# Patient Record
Sex: Male | Born: 1940 | Race: White | Hispanic: No | Marital: Married | Smoking: Current every day smoker
Health system: Southern US, Community
[De-identification: ages and names within clinical notes are randomized; demographics above are authoritative.]

## PROBLEM LIST (undated history)

## (undated) DIAGNOSIS — E039 Hypothyroidism, unspecified: Secondary | ICD-10-CM

## (undated) DIAGNOSIS — Z8679 Personal history of other diseases of the circulatory system: Secondary | ICD-10-CM

## (undated) DIAGNOSIS — I10 Essential (primary) hypertension: Secondary | ICD-10-CM

## (undated) DIAGNOSIS — I779 Disorder of arteries and arterioles, unspecified: Secondary | ICD-10-CM

## (undated) DIAGNOSIS — I739 Peripheral vascular disease, unspecified: Secondary | ICD-10-CM

## (undated) DIAGNOSIS — E785 Hyperlipidemia, unspecified: Secondary | ICD-10-CM

## (undated) DIAGNOSIS — T300 Burn of unspecified body region, unspecified degree: Secondary | ICD-10-CM

## (undated) HISTORY — DX: Hypothyroidism, unspecified: E03.9

## (undated) HISTORY — DX: Peripheral vascular disease, unspecified: I73.9

## (undated) HISTORY — DX: Personal history of other diseases of the circulatory system: Z86.79

## (undated) HISTORY — PX: OTHER SURGICAL HISTORY: SHX169

## (undated) HISTORY — DX: Essential (primary) hypertension: I10

## (undated) HISTORY — DX: Hyperlipidemia, unspecified: E78.5

## (undated) HISTORY — DX: Disorder of arteries and arterioles, unspecified: I77.9

---

## 2002-10-27 ENCOUNTER — Encounter: Payer: Self-pay | Admitting: Family Medicine

## 2002-10-27 ENCOUNTER — Ambulatory Visit (HOSPITAL_COMMUNITY): Admission: RE | Admit: 2002-10-27 | Discharge: 2002-10-27 | Payer: Self-pay | Admitting: Family Medicine

## 2004-10-29 ENCOUNTER — Ambulatory Visit (HOSPITAL_COMMUNITY): Admission: RE | Admit: 2004-10-29 | Discharge: 2004-10-29 | Payer: Self-pay | Admitting: General Surgery

## 2004-10-29 HISTORY — PX: INGUINAL HERNIA REPAIR: SUR1180

## 2010-02-12 ENCOUNTER — Emergency Department (HOSPITAL_COMMUNITY): Admission: EM | Admit: 2010-02-12 | Discharge: 2010-02-12 | Payer: Self-pay | Admitting: Emergency Medicine

## 2010-02-27 ENCOUNTER — Ambulatory Visit (HOSPITAL_COMMUNITY)
Admission: RE | Admit: 2010-02-27 | Discharge: 2010-02-27 | Payer: Self-pay | Source: Home / Self Care | Admitting: General Surgery

## 2010-03-25 HISTORY — PX: COLONOSCOPY W/ POLYPECTOMY: SHX1380

## 2010-06-04 LAB — GLUCOSE, CAPILLARY: Glucose-Capillary: 133 mg/dL — ABNORMAL HIGH (ref 70–99)

## 2010-06-05 LAB — COMPREHENSIVE METABOLIC PANEL
ALT: 17 U/L (ref 0–53)
AST: 20 U/L (ref 0–37)
Alkaline Phosphatase: 31 U/L — ABNORMAL LOW (ref 39–117)
CO2: 26 mEq/L (ref 19–32)
Calcium: 9.6 mg/dL (ref 8.4–10.5)
GFR calc Af Amer: >60 mL/min (ref >60)
Potassium: 4.1 mEq/L (ref 3.5–5.1)
Sodium: 138 mEq/L (ref 135–145)
Total Protein: 8.1 g/dL (ref 6.0–8.3)

## 2010-06-05 LAB — DIFFERENTIAL
Basophils Relative: 0 % (ref 0–1)
Eosinophils Absolute: 0.1 10*3/uL (ref 0.0–0.7)
Eosinophils Relative: 1 % (ref 0–5)
Lymphs Abs: 1.2 10*3/uL (ref 0.7–4.0)
Monocytes Relative: 4 % (ref 3–12)

## 2010-06-05 LAB — CBC
HCT: 46.1 % (ref 39.0–52.0)
Hemoglobin: 16.3 g/dL (ref 13.0–17.0)
MCHC: 35.4 g/dL (ref 30.0–36.0)
RDW: 13 % (ref 11.5–15.5)
WBC: 11.8 10*3/uL — ABNORMAL HIGH (ref 4.0–10.5)

## 2010-08-10 NOTE — H&P (Signed)
NAME:  [name], [name]           ACCOUNT NO.:  [id number]   MEDICAL RECORD NO.:  [id number]          PATIENT TYPE:  AMB   LOCATION:                                FACILITY:  [name]   PHYSICIAN:  Dalia Heading, M.D.  DATE OF BIRTH:  1940/12/28   DATE OF ADMISSION:  DATE OF DISCHARGE:  LH                                HISTORY & PHYSICAL   CHIEF COMPLAINT:  Right inguinal hernia.   HISTORY OF PRESENT ILLNESS:  The patient is a 70 year old white male who is  referred for evaluation and treatment of a right inguinal hernia. He has had  intermittent right groin pain with radiation to the right testicle for many  years, but recently has increased in frequency and intensity. It is made  worse with heavy lifting. No nausea or vomiting have been noted.   PAST MEDICAL HISTORY:  1.  Hypertension.  2.  Benign prostatic hypertrophy.   PAST SURGICAL HISTORY:  Unremarkable.   CURRENT MEDICATIONS:  Cardura and a baby aspirin.   ALLERGIES:  No known drug allergies.   REVIEW OF SYSTEMS:  The patient smokes a pack of cigarettes a day. He denies  any significant alcohol use. He denies any cardiopulmonary difficulties or  bleeding disorders.   PHYSICAL EXAMINATION:  GENERAL:  The patient is a well-developed, well-  nourished, white male in no acute distress. He is afebrile and vital signs  are stable.  LUNGS:  Clear to auscultation with equal breath sounds bilaterally.  HEART:  Reveals regular rate and rhythm without S3, S4, or murmurs.  ABDOMEN:  Soft and nondistended. He is tender in the right inguinal region  with a reducible right inguinal hernia noted. No hepatosplenomegaly or  masses are noted.  GENITOURINARY:  Within normal limits.   IMPRESSION:  Right inguinal hernia.   PLAN:  The patient is scheduled for right inguinal herniorrhaphy on October 29, 2004. The risks and benefits of the procedure including bleeding,  infection, and recurrence of the hernia were fully explained to the  patient  who gave informed consent.       MAJ/MEDQ  D:  10/25/2004  T:  10/25/2004  Job:  [id number]   cc:   [facility]  Fax: [phone]   Kirk Ruths, M.D.  [address]  [place]  [place] [address]  Fax: [phone]

## 2010-08-10 NOTE — Op Note (Signed)
NAME:  [name], [name]           ACCOUNT NO.:  [id number]   MEDICAL RECORD NO.:  [id number]          PATIENT TYPE:  AMB   LOCATION:  DAY                           FACILITY:  [name]   PHYSICIAN:  Dalia Heading, M.D.  DATE OF BIRTH:  November 29, 1940   DATE OF PROCEDURE:  10/29/2004  DATE OF DISCHARGE:                                 OPERATIVE REPORT   PREOPERATIVE DIAGNOSIS:  Right inguinal hernia.   POSTOPERATIVE DIAGNOSIS:  Right inguinal hernia, direct.   PROCEDURE:  Right inguinal herniorrhaphy.   SURGEON:  Dr. Franky Macho.   ANESTHESIA:  General.   INDICATIONS:  The patient is a 70 year old white male who presents with  symptomatic right inguinal hernia. Risks and benefits of the procedure  including bleeding, infection, pain, and recurrence of the hernia were fully  explained to the patient, who gave informed consent.   PROCEDURE NOTE:  The patient was placed in supine position. After general  anesthesia was administered, right groin region was prepped and draped using  the usual sterile technique with Betadine. Surgical site confirmation was  performed.   A right transverse inguinal incision was made down to the external oblique  aponeurosis. The aponeurosis was incised to the external ring. The  ilioinguinal nerve was identified and retracted superiorly from the  operative field. The Penrose drain was placed around the spermatic cord. The  vas deferens was noted in the spermatic cord. A lipoma of the cord was  excised. The patient was noted to have a direct hernia defect. This was  incised along its base, and a large-size polypropylene mesh plug was then  placed into this region and secured circumferentially to the transversalis  fascia using 2-0 Novofil interrupted sutures. An onlay polypropylene mesh  patch was then placed along the floor of the inguinal canal and secured  superiorly to the conjoined tendon and inferiorly to the shelving edge of  Poupart's ligament  using a 2-0 Novofil interrupted suture. The internal ring  was recreated using a 2-0 Novofil interrupted suture. The external oblique  aponeurosis was reapproximated using a 2-0 Vicryl running suture.  Subcutaneous layer was reapproximated using a 3-0 Vicryl interrupted suture.  The skin was closed using a 4-0 Vicryl subcuticular suture. Sensorcaine 0.5%  was instilled in the surrounding wound. Dermabond was then applied.   All tape and needle counts were correct at the end of the procedure. The  patient was awakened and transferred to PACU in stable condition.   COMPLICATIONS:  None.   SPECIMEN:  None.   BLOOD LOSS:  Minimal.       MAJ/MEDQ  D:  10/29/2004  T:  10/29/2004  Job:  [id number]   cc:   Kirk Ruths, M.D.  [address]  [place]  [place] [address]  Fax: [phone]

## 2010-10-23 ENCOUNTER — Ambulatory Visit (INDEPENDENT_AMBULATORY_CARE_PROVIDER_SITE_OTHER): Payer: Medicare Other | Admitting: Orthopedic Surgery

## 2010-10-23 ENCOUNTER — Encounter: Payer: Self-pay | Admitting: Orthopedic Surgery

## 2010-10-23 VITALS — Resp 16 | Ht 71.0 in | Wt 243.0 lb

## 2010-10-23 DIAGNOSIS — M23329 Other meniscus derangements, posterior horn of medial meniscus, unspecified knee: Secondary | ICD-10-CM

## 2010-10-23 NOTE — H&P (Signed)
[name] is an 70 y.o. male.   Chief Complaint: right knee pain  HPI: 70 years old male injured in November of 2011, RIGHT knee, twisted. Patient was able to continue hunting, things seemed to get better until last week or so when he started having vomiting 8/10. Medial knee pain associated with swelling and giving out of the RIGHT knee.  Clinical exam indicates torn medial meniscus visualized proceed with surgery. He did have a plain film, which was negative for any acute injury.    Past Medical History  Diagnosis Date  . HTN (hypertension)     Past Surgical History  Procedure Date  . Hernia repair     No family history on file. Social History:  reports that he has been smoking Cigarettes.  He has been smoking about 1 pack per day. He does not have any smokeless tobacco history on file. He reports that he does not drink alcohol or use illicit drugs.  Allergies: No Known Allergies  Medications Prior to Admission  Medication Sig Dispense Refill  . cephALEXin (KEFLEX) 500 MG capsule       . doxazosin (CARDURA) 4 MG tablet       . silver sulfADIAZINE (SILVADENE) 1 % cream        No current facility-administered medications on file as of 10/23/2010.    No results found for this or any previous visit (from the past 48 hour(s)). @RISRSLT48 @  Review of Systems  Constitutional: Negative.   HENT: Negative.   Eyes: Negative.   Respiratory: Negative.   Cardiovascular: Negative.   Gastrointestinal: Negative.   Genitourinary: Negative.   Musculoskeletal: Positive for joint pain.  Skin: Negative.   Neurological: Negative.   Endo/Heme/Allergies: Negative.   Psychiatric/Behavioral: Negative.     Resp. rate 16, height 5\' 11"  (1.803 m), weight 243 lb (110.224 kg). Physical Exam  Vital signs are stable as recorded  General appearance is normal  The patient is alert and oriented x3  The patient's mood and affect are normal  Gait assessment: normal  The  cardiovascular exam reveals normal pulses and temperature without edema swelling.  The lymphatic system is negative for palpable lymph nodes  The sensory exam is normal.  There are no pathologic reflexes.  Balance is normal.   Exam of the right knee  Inspection medial knee pain  Range of motion normal  Stability test were normal  Strength normal  Skin healing skin burn should be ok in 10 days    Assessment/Plan Medial meniscus tear right knee   [name]   [name] 10/23/2010, 4:02 PM

## 2010-10-23 NOTE — Progress Notes (Signed)
History done  in the hospital medical record.  Medial meniscal tear.  Plan for medial meniscectomy.  This procedure has been fully reviewed with the patient and written informed consent has been obtained.  You have been scheduled for surgery.  All surgeries carry some risk.  Remember you always have the option of continued nonsurgical treatment. However in this situation the risks vs. the benefits favor surgery as the best treatment option. The risks of the surgery includes the following but is not limited to bleeding, infection, pulmonary embolus, death from anesthesia, nerve injury vascular injury or need for further surgery, continued pain.  Specific to this procedure the following risks and complications are rare but possible Swelling

## 2010-10-26 ENCOUNTER — Encounter (HOSPITAL_COMMUNITY): Payer: Self-pay

## 2010-10-26 ENCOUNTER — Telehealth: Payer: Self-pay | Admitting: Orthopedic Surgery

## 2010-10-26 ENCOUNTER — Encounter (HOSPITAL_COMMUNITY)
Admission: RE | Admit: 2010-10-26 | Discharge: 2010-10-26 | Disposition: A | Discharge status: Home / Self Care | Payer: Medicare Other | Source: Ambulatory Visit | Attending: Orthopedic Surgery | Admitting: Orthopedic Surgery

## 2010-10-26 HISTORY — DX: Burn of unspecified body region, unspecified degree: T30.0

## 2010-10-26 LAB — CBC
MCH: 34.1 pg — ABNORMAL HIGH (ref 26.0–34.0)
MCHC: 33.8 g/dL (ref 30.0–36.0)
MCV: 100.9 fL — ABNORMAL HIGH (ref 78.0–100.0)
Platelets: 203 10*3/uL (ref 150–400)
RBC: 4.6 MIL/uL (ref 4.22–5.81)
RDW: 13 % (ref 11.5–15.5)

## 2010-10-26 LAB — BASIC METABOLIC PANEL
BUN: 8 mg/dL (ref 6–23)
CO2: 23 mEq/L (ref 19–32)
Calcium: 9.7 mg/dL (ref 8.4–10.5)
Creatinine, Ser: 1.12 mg/dL (ref 0.50–1.35)
Glucose, Bld: 147 mg/dL — ABNORMAL HIGH (ref 70–99)

## 2010-10-26 NOTE — Telephone Encounter (Signed)
Contacted insurer [employer] at ph [phone] RE: out-patient surgery scheduled at [facility] 11/02/10.  CPT [id number], [id number], ICD9 717.2, per phone with Nanci Pina, no pre-authorization required for in-network providers.

## 2010-10-26 NOTE — Patient Instructions (Signed)
20 [name]  10/26/2010   Your procedure is scheduled on:  Friday, 11/02/10  [facility] at [facility]:30 [facility].  Call this number if you have problems the morning of surgery: [phone]   Remember:   Do not eat food:After Midnight.  Do not drink clear liquids: After Midnight.  Take these medicines the morning of surgery with A SIP OF WATER: Cardura   Do not wear jewelry, make-up or nail polish.  Do not wear lotions, powders, or perfumes. You may wear deodorant.  Do not shave 48 hours prior to surgery.  Do not bring valuables to the hospital.  Contacts, dentures or bridgework may not be worn into surgery.  Leave suitcase in the car. After surgery it may be brought to your room.  For patients admitted to the hospital, checkout time is 11:00 AM the day of discharge.   Patients discharged the day of surgery will not be allowed to drive home.  Name and phone number of your driver: wife, [name]  Special Instructions: CHG Shower Use Special Wash: 1/2 bottle night before surgery and 1/2 bottle morning of surgery.   Please read over the following fact sheets that you were given: Pain Booklet, MRSA Information, Surgical Site Infection Prevention, Anesthesia Post-op Instructions and Care and Recovery After Surgery  General Instructions for Surgery These instructions are generic. They cover multiple surgeries and are a general pre-surgical guideline. They do not apply to all procedures. You may have questions. Answers will be provided by your caregiver. PREPARING FOR SURGERY  Stop smoking at least two weeks prior to surgery. This lowers risk during surgery. Ask your caregiver for help with this if needed. The benefits are well worth it. This is a good time for a healthy lifestyle change.   Your caregiver may advise that you stop taking certain medications that may affect the outcome of the surgery and your ability to heal. For example, you may need to stop taking anti-inflammatories, such as  aspirin or ibuprofen because of possible bleeding problems. Other medications may have interactions with anesthesia.   BE SURE TO LET YOUR CAREGIVER KNOW IF YOU HAVE BEEN ON STEROIDS FOR LONG PERIODS OF TIME. THIS IS CRITICAL.   Your caregiver will discuss possible risks and complications with you before surgery. In addition to the usual risks of anesthesia, other common risks and complications include blood loss and replacement (not applicable to minor surgical procedures), temporary increase in pain due to surgery, uncorrected pain or problems the surgery was meant to correct, infection, or new damage.  BEFORE SURGERY Arrive   prior to your procedure or as your caregiver suggests. Check in at the admissions desk to fill out necessary forms if you are not pre-registered. There will be consent forms to sign prior to the procedure. There is a waiting area for your family while you are having your procedure.  LET YOUR CAREGIVERS KNOW ABOUT THE FOLLOWING:  Allergies.  Medications taken including herbs, eye drops, over the counter medications, and creams.   Use of steroids (by mouth or creams).   Previous problems with anesthetics or novocaine.   Possibility of pregnancy, if this applies.  History of blood clots (thrombophlebitis).   History of bleeding or blood problems.   Previous surgery.   Other health problems.   FOLLOWING SURGERY You will be taken to the recovery area where a nurse will watch and check your progress. Once you are awake, stable, and taking fluids well, barring other problems you will  be allowed to go home. Once home, an ice pack wrapped in a light towel applied to your operative site may help with discomfort and keep the swelling down. Follow instructions as suggested by your caregiver. HOME CARE INSTRUCTIONS  Follow your caregiver's instructions as to activities, exercises, physical therapy, or driving a car.   Weight reduction may be helpful depending upon the type  of surgery.   Daily exercise is helpful. Maintain strength and range of motion as instructed.   Only take over-the-counter or prescription medicines for pain, discomfort, or fever as directed by your caregiver.  SEEK MEDICAL CARE IF:  You notice increased bleeding (more than a small spot) from the wound.   You soak more than four pads following a uterine procedure unless instructed otherwise.   There is redness, swelling, or increasing pain in the wound.   You notice pus coming from wound.   An unexplained oral temperature over 101 develops, or as your caregiver suggests.   You notice a foul smell coming from the wound or dressing.   You become light headed or if you pass out (faint).  SEEK IMMEDIATE MEDICAL CARE IF:  You develop a rash.   You have difficulty breathing.   You develop any allergic problems.  Document Released: 06/17/2000 Document Re-Released: 06/07/2008 [facility] Patient Information 2011 [place], [place]. PATIENT INSTRUCTIONS POST-ANESTHESIA  IMMEDIATELY FOLLOWING SURGERY:  Do not drive or operate machinery for the first twenty four hours after surgery.  Do not make any important decisions for twenty four hours after surgery or while taking narcotic pain medications or sedatives.  If you develop intractable nausea and vomiting or a severe headache please notify your doctor immediately.  FOLLOW-UP:  Please make an appointment with your surgeon as instructed. You do not need to follow up with anesthesia unless specifically instructed to do so.  WOUND CARE INSTRUCTIONS (if applicable):  Keep a dry clean dressing on the anesthesia/puncture wound site if there is drainage.  Once the wound has quit draining you may leave it open to air.  Generally you should leave the bandage intact for twenty four hours unless there is drainage.  If the epidural site drains for more than 36-48 hours please call the anesthesia department.  QUESTIONS?:  Please feel free to call your  physician or the hospital operator if you have any questions, and they will be happy to assist you.

## 2010-11-01 ENCOUNTER — Other Ambulatory Visit: Payer: Self-pay | Admitting: Orthopedic Surgery
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
## 2010-11-02 ENCOUNTER — Encounter (HOSPITAL_COMMUNITY): Payer: Self-pay | Admitting: Anesthesiology

## 2010-11-02 ENCOUNTER — Encounter (HOSPITAL_COMMUNITY): Payer: Self-pay | Admitting: *Deleted

## 2010-11-02 ENCOUNTER — Ambulatory Visit (HOSPITAL_COMMUNITY)
Admission: RE | Admit: 2010-11-02 | Discharge: 2010-11-02 | Disposition: A | Discharge status: Home / Self Care | Payer: Medicare Other | Source: Ambulatory Visit | Attending: Orthopedic Surgery | Admitting: Orthopedic Surgery

## 2010-11-02 ENCOUNTER — Ambulatory Visit (HOSPITAL_COMMUNITY): Payer: Medicare Other

## 2010-11-02 ENCOUNTER — Encounter (HOSPITAL_COMMUNITY)
Admission: RE | Disposition: A | Discharge status: Home / Self Care | Payer: Self-pay | Source: Ambulatory Visit | Attending: Orthopedic Surgery

## 2010-11-02 ENCOUNTER — Ambulatory Visit (HOSPITAL_COMMUNITY): Payer: Medicare Other | Admitting: Anesthesiology

## 2010-11-02 DIAGNOSIS — IMO0002 Reserved for concepts with insufficient information to code with codable children: Secondary | ICD-10-CM | POA: Insufficient documentation

## 2010-11-02 DIAGNOSIS — M23329 Other meniscus derangements, posterior horn of medial meniscus, unspecified knee: Secondary | ICD-10-CM

## 2010-11-02 DIAGNOSIS — Z79899 Other long term (current) drug therapy: Secondary | ICD-10-CM | POA: Insufficient documentation

## 2010-11-02 DIAGNOSIS — M1710 Unilateral primary osteoarthritis, unspecified knee: Secondary | ICD-10-CM | POA: Insufficient documentation

## 2010-11-02 DIAGNOSIS — Z01812 Encounter for preprocedural laboratory examination: Secondary | ICD-10-CM | POA: Insufficient documentation

## 2010-11-02 DIAGNOSIS — M23305 Other meniscus derangements, unspecified medial meniscus, unspecified knee: Secondary | ICD-10-CM | POA: Insufficient documentation

## 2010-11-02 DIAGNOSIS — I10 Essential (primary) hypertension: Secondary | ICD-10-CM | POA: Insufficient documentation

## 2010-11-02 SURGERY — ARTHROSCOPY, KNEE, WITH MEDIAL MENISCECTOMY
Anesthesia: General | Site: Knee | Laterality: Right | Wound class: Clean

## 2010-11-02 MED ORDER — ACETAMINOPHEN 325 MG PO TABS: 325.0000 mg | ORAL_TABLET | ORAL | Status: DC | PRN

## 2010-11-02 MED ORDER — PROPOFOL 10 MG/ML IV EMUL
INTRAVENOUS | Status: AC
  Filled 2010-11-02: qty 20

## 2010-11-02 MED ORDER — HYDROCODONE-ACETAMINOPHEN 5-325 MG PO TABS
ORAL_TABLET | ORAL | Status: AC
  Administered 2010-11-02: 1 via ORAL
  Filled 2010-11-02: qty 1

## 2010-11-02 MED ORDER — LACTATED RINGERS IV SOLN
INTRAVENOUS | Status: DC
  Administered 2010-11-02: 09:00:00 via INTRAVENOUS

## 2010-11-02 MED ORDER — CELECOXIB 100 MG PO CAPS
400.0000 mg | ORAL_CAPSULE | Freq: Once | ORAL | Status: AC
  Administered 2010-11-02: 400 mg via ORAL

## 2010-11-02 MED ORDER — FENTANYL CITRATE 0.05 MG/ML IJ SOLN: 25.0000 ug | INTRAMUSCULAR | Status: DC | PRN

## 2010-11-02 MED ORDER — FENTANYL CITRATE 0.05 MG/ML IJ SOLN
INTRAMUSCULAR | Status: DC | PRN
  Administered 2010-11-02 (×2): 50 ug via INTRAVENOUS

## 2010-11-02 MED ORDER — BUPIVACAINE-EPINEPHRINE PF 0.5-1:200000 % IJ SOLN
INTRAMUSCULAR | Status: AC
  Filled 2010-11-02: qty 10

## 2010-11-02 MED ORDER — ONDANSETRON HCL 4 MG/2ML IJ SOLN
INTRAMUSCULAR | Status: AC
  Administered 2010-11-02: 4 mg via INTRAVENOUS
  Filled 2010-11-02: qty 2

## 2010-11-02 MED ORDER — MIDAZOLAM HCL 2 MG/2ML IJ SOLN
INTRAMUSCULAR | Status: AC
  Filled 2010-11-02: qty 2

## 2010-11-02 MED ORDER — ONDANSETRON HCL 4 MG/2ML IJ SOLN
4.0000 mg | Freq: Once | INTRAMUSCULAR | Status: AC | PRN
  Administered 2010-11-02: 4 mg via INTRAVENOUS

## 2010-11-02 MED ORDER — PROPOFOL 10 MG/ML IV EMUL
INTRAVENOUS | Status: DC | PRN
  Administered 2010-11-02: 160 mg via INTRAVENOUS

## 2010-11-02 MED ORDER — ONDANSETRON HCL 4 MG/2ML IJ SOLN
4.0000 mg | Freq: Once | INTRAMUSCULAR | Status: AC
  Administered 2010-11-02: 4 mg via INTRAVENOUS

## 2010-11-02 MED ORDER — HYDROCODONE-ACETAMINOPHEN 7.5-325 MG PO TABS: 1.0000 | ORAL_TABLET | ORAL | Status: AC | PRN

## 2010-11-02 MED ORDER — ACETAMINOPHEN 500 MG PO TABS
500.0000 mg | ORAL_TABLET | Freq: Once | ORAL | Status: AC
  Administered 2010-11-02: 500 mg via ORAL

## 2010-11-02 MED ORDER — CEFAZOLIN SODIUM 1-5 GM-% IV SOLN
INTRAVENOUS | Status: AC
  Filled 2010-11-02: qty 50

## 2010-11-02 MED ORDER — CEFAZOLIN SODIUM 1-5 GM-% IV SOLN: 1.0000 g | INTRAVENOUS | Status: DC

## 2010-11-02 MED ORDER — BUPIVACAINE-EPINEPHRINE 0.5% -1:200000 IJ SOLN
INTRAMUSCULAR | Status: DC | PRN
  Administered 2010-11-02 (×2): 30 mL

## 2010-11-02 MED ORDER — SODIUM CHLORIDE 0.9 % IR SOLN
Status: DC | PRN
  Administered 2010-11-02: 10:00:00

## 2010-11-02 MED ORDER — CELECOXIB 100 MG PO CAPS
ORAL_CAPSULE | ORAL | Status: AC
  Administered 2010-11-02: 400 mg via ORAL
  Filled 2010-11-02: qty 4

## 2010-11-02 MED ORDER — EPINEPHRINE HCL 1 MG/ML IJ SOLN
INTRAMUSCULAR | Status: AC
  Filled 2010-11-02: qty 4

## 2010-11-02 MED ORDER — ONDANSETRON HCL 4 MG/2ML IJ SOLN: 4.0000 mg | Freq: Once | INTRAMUSCULAR | Status: DC

## 2010-11-02 MED ORDER — GLYCOPYRROLATE 0.2 MG/ML IJ SOLN
0.2000 mg | Freq: Once | INTRAMUSCULAR | Status: AC | PRN
  Administered 2010-11-02: 0.2 mg via INTRAVENOUS

## 2010-11-02 MED ORDER — MIDAZOLAM HCL 5 MG/5ML IJ SOLN
INTRAMUSCULAR | Status: DC | PRN
  Administered 2010-11-02 (×2): 2 mg via INTRAVENOUS

## 2010-11-02 MED ORDER — GLYCOPYRROLATE 0.2 MG/ML IJ SOLN
INTRAMUSCULAR | Status: AC
  Filled 2010-11-02: qty 1

## 2010-11-02 MED ORDER — HYDROCODONE-ACETAMINOPHEN 5-325 MG PO TABS
1.0000 | ORAL_TABLET | Freq: Once | ORAL | Status: AC
  Administered 2010-11-02: 1 via ORAL

## 2010-11-02 MED ORDER — ONDANSETRON HCL 4 MG/2ML IJ SOLN
INTRAMUSCULAR | Status: AC
  Filled 2010-11-02: qty 2

## 2010-11-02 MED ORDER — CEFAZOLIN SODIUM 1-5 GM-% IV SOLN
INTRAVENOUS | Status: DC | PRN
  Administered 2010-11-02: 1 g via INTRAVENOUS

## 2010-11-02 MED ORDER — ACETAMINOPHEN 500 MG PO TABS
ORAL_TABLET | ORAL | Status: AC
  Administered 2010-11-02: 500 mg via ORAL
  Filled 2010-11-02: qty 1

## 2010-11-02 MED ORDER — EPINEPHRINE HCL 1 MG/ML IJ SOLN
INTRAMUSCULAR | Status: AC
  Filled 2010-11-02: qty 2

## 2010-11-02 MED ORDER — FENTANYL CITRATE 0.05 MG/ML IJ SOLN
INTRAMUSCULAR | Status: AC
  Filled 2010-11-02: qty 2

## 2010-11-02 MED ORDER — LACTATED RINGERS IV SOLN
INTRAVENOUS | Status: DC | PRN
  Administered 2010-11-02: 09:00:00 via INTRAVENOUS

## 2010-11-02 MED ORDER — MIDAZOLAM HCL 2 MG/2ML IJ SOLN
1.0000 mg | INTRAMUSCULAR | Status: DC | PRN
  Administered 2010-11-02: 2 mg via INTRAVENOUS

## 2010-11-02 SURGICAL SUPPLY — 57 items
ARTHROWAND PARAGON T2 (SURGICAL WAND)
BAG HAMPER (MISCELLANEOUS) ×2 IMPLANT
BANDAGE ELASTIC 6 VELCRO NS (GAUZE/BANDAGES/DRESSINGS) ×2 IMPLANT
BLADE AGGRESSIVE PLUS 4.0 (BLADE) ×2 IMPLANT
BLADE SURG SZ11 CARB STEEL (BLADE) ×2 IMPLANT
CHLORAPREP W/TINT 26ML (MISCELLANEOUS) ×2 IMPLANT
CLOTH BEACON ORANGE TIMEOUT ST (SAFETY) ×2 IMPLANT
COOLER CRYO IC GRAV AND TUBE (ORTHOPEDIC SUPPLIES) ×2 IMPLANT
CUFF CRYO KNEE LG 20X31 COOLER (ORTHOPEDIC SUPPLIES) IMPLANT
CUFF CRYO KNEE18X23 MED (MISCELLANEOUS) ×1 IMPLANT
CUFF TOURNIQUET SINGLE 34IN LL (TOURNIQUET CUFF) ×1 IMPLANT
CUFF TOURNIQUET SINGLE 44IN (TOURNIQUET CUFF) IMPLANT
CUTTER ANGLED DBL BITE 4.5 (BURR) IMPLANT
DECANTER SPIKE VIAL GLASS SM (MISCELLANEOUS) ×4 IMPLANT
FLOOR PAD 36X40 (MISCELLANEOUS)
GAUZE SPONGE 4X4 16PLY XRAY LF (GAUZE/BANDAGES/DRESSINGS) ×2 IMPLANT
GAUZE XEROFORM 5X9 LF (GAUZE/BANDAGES/DRESSINGS) ×2 IMPLANT
GLOVE BIOGEL PI IND STRL 7.5 (GLOVE) IMPLANT
GLOVE BIOGEL PI INDICATOR 7.5 (GLOVE) ×1
GLOVE ECLIPSE 7.0 STRL STRAW (GLOVE) ×2 IMPLANT
GLOVE SKINSENSE NS SZ8.0 LF (GLOVE) ×1
GLOVE SKINSENSE STRL SZ8.0 LF (GLOVE) ×1 IMPLANT
GLOVE SS N UNI LF 8.5 STRL (GLOVE) ×2 IMPLANT
GOWN BRE IMP SLV AUR XL STRL (GOWN DISPOSABLE) ×2 IMPLANT
GOWN STRL REIN XL XLG (GOWN DISPOSABLE) ×2 IMPLANT
HLDR LEG FOAM (MISCELLANEOUS) ×1 IMPLANT
IV NS IRRIG 3000ML ARTHROMATIC (IV SOLUTION) ×5 IMPLANT
KIT BLADEGUARD II DBL (SET/KITS/TRAYS/PACK) ×2 IMPLANT
KIT ROOM TURNOVER AP CYSTO (KITS) ×2 IMPLANT
LEG HOLDER FOAM (MISCELLANEOUS) ×1
MANIFOLD NEPTUNE II (INSTRUMENTS) ×2 IMPLANT
MARKER SKIN DUAL TIP RULER LAB (MISCELLANEOUS) ×2 IMPLANT
NDL HYPO 18GX1.5 BLUNT FILL (NEEDLE) ×1 IMPLANT
NDL HYPO 21X1.5 SAFETY (NEEDLE) ×1 IMPLANT
NDL SPNL 18GX3.5 QUINCKE PK (NEEDLE) ×1 IMPLANT
NEEDLE HYPO 18GX1.5 BLUNT FILL (NEEDLE) ×2 IMPLANT
NEEDLE HYPO 21X1.5 SAFETY (NEEDLE) ×2 IMPLANT
NEEDLE SPNL 18GX3.5 QUINCKE PK (NEEDLE) ×2 IMPLANT
NS IRRIG 1000ML POUR BTL (IV SOLUTION) ×2 IMPLANT
PACK ARTHRO LIMB DRAPE STRL (MISCELLANEOUS) ×2 IMPLANT
PAD ABD 5X9 TENDERSORB (GAUZE/BANDAGES/DRESSINGS) ×2 IMPLANT
PAD ARMBOARD 7.5X6 YLW CONV (MISCELLANEOUS) ×2 IMPLANT
PAD FLOOR 36X40 (MISCELLANEOUS) ×1 IMPLANT
PADDING CAST COTTON 6X4 STRL (CAST SUPPLIES) ×2 IMPLANT
PADDING WEBRIL 4 STERILE (GAUZE/BANDAGES/DRESSINGS) ×1 IMPLANT
SET ARTHROSCOPY INST (INSTRUMENTS) ×2 IMPLANT
SET ARTHROSCOPY PUMP TUBE (IRRIGATION / IRRIGATOR) ×2 IMPLANT
SET BASIN LINEN APH (SET/KITS/TRAYS/PACK) ×2 IMPLANT
SPONGE GAUZE 4X4 12PLY (GAUZE/BANDAGES/DRESSINGS) ×2 IMPLANT
STRIP CLOSURE SKIN 1/2X4 (GAUZE/BANDAGES/DRESSINGS) ×1 IMPLANT
SUT ETHILON 3 0 FSL (SUTURE) IMPLANT
SYR 30ML LL (SYRINGE) ×2 IMPLANT
SYRINGE 10CC LL (SYRINGE) ×2 IMPLANT
WAND 50 DEG COVAC W/CORD (SURGICAL WAND) IMPLANT
WAND 90 DEG TURBOVAC W/CORD (SURGICAL WAND) ×1 IMPLANT
WAND ARTHRO PARAGON T2 (SURGICAL WAND) IMPLANT
YANKAUER SUCT BULB TIP 10FT TU (MISCELLANEOUS) ×6 IMPLANT

## 2010-11-02 NOTE — Transfer of Care (Signed)
Immediate Anesthesia Transfer of Care Note  Patient: [name]  Procedure(s) Performed:  KNEE ARTHROSCOPY WITH MEDIAL MENISECTOMY - With Partial Medial Menisectomy  Patient Location: PACU  Anesthesia Type: General  Level of Consciousness: sedated and patient cooperative  Airway & Oxygen Therapy: Patient Spontanous Breathing and Patient connected to face mask oxygen  Post-op Assessment: Report given to PACU RN, Post -op Vital signs reviewed and stable and Patient moving all extremities  Post vital signs: Reviewed and stable  Complications: No apparent anesthesia complications

## 2010-11-02 NOTE — Anesthesia Postprocedure Evaluation (Signed)
  Anesthesia Post-op Note  Patient: [name]  Procedure(s) Performed:  KNEE ARTHROSCOPY WITH MEDIAL MENISECTOMY - With Partial Medial Menisectomy  Patient Location: PACU  Anesthesia Type: General  Level of Consciousness: awake, alert , oriented and patient cooperative  Airway and Oxygen Therapy: Patient Spontanous Breathing  Post-op Pain: none  Post-op Assessment: Post-op Vital signs reviewed, Patient's Cardiovascular Status Stable, Respiratory Function Stable, Patent Airway, No signs of Nausea or vomiting and Pain level controlled  Post-op Vital Signs: Reviewed and stable  Complications: No apparent anesthesia complications
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
## 2010-11-02 NOTE — Interval H&P Note (Signed)
History and Physical Interval Note:   11/02/2010   8:59 AM   [name]  has presented today for surgery, with the diagnosis of torn medial meniscus right knee  The various methods of treatment have been discussed with the patient and family. After consideration of risks, benefits and other options for treatment, the patient has consented to  Procedure(s):RIGHT  KNEE ARTHROSCOPY WITH MEDIAL MENISECTOMY as a surgical intervention .  I have reviewed the patients' chart and labs.  Questions were answered to the patient's satisfaction.     [name]  MD

## 2010-11-02 NOTE — Anesthesia Procedure Notes (Signed)
Procedure Name: LMA Insertion Date/Time: 11/02/2010 9:38 AM Performed by: Despina Hidden Patient Re-evaluated:Patient Re-evaluated prior to inductionOxygen Delivery Method: Circle System Utilized Preoxygenation: Pre-oxygenation with 100% oxygen Intubation Type: IV induction Ventilation: Mask ventilation with difficulty LMA: LMA inserted LMA Size: 4.0 Number of attempts: 1 Placement Confirmation: breath sounds checked- equal and bilateral and positive ETCO2 Tube secured with: Tape

## 2010-11-02 NOTE — Brief Op Note (Signed)
11/02/2010  10:16 AM  PATIENT:  [name]  70 y.o. male  PRE-OPERATIVE DIAGNOSIS:  torn medial meniscus right knee  POST-OPERATIVE DIAGNOSIS:  torn medial meniscus right knee                                                         OA knee moderate PROCEDURE:  Procedure(s): RIGHT KNEE ARTHROSCOPY WITH MEDIAL MENISECTOMY  SURGEON:  Surgeon(s): [name], MD  PHYSICIAN ASSISTANT:   ASSISTANTS: none   ANESTHESIA:   general  ESTIMATED BLOOD LOSS: * No blood loss amount entered *   BLOOD ADMINISTERED:none  DRAINS: none   LOCAL MEDICATIONS USED:  MARCAINE 60CC  SPECIMEN:  No Specimen  DISPOSITION OF SPECIMEN:  N/A  COUNTS:  YES  TOURNIQUET:  * Missing tourniquet times found for documented tourniquets in log:  1690 *  DICTATION #:   PLAN OF CARE: pacu then discharge   PATIENT DISPOSITION:  PACU - hemodynamically stable.   Delay start of Pharmacological VTE agent (>24hrs) due to surgical blood loss or risk of bleeding:  not applicable

## 2010-11-02 NOTE — Op Note (Signed)
Preop diagnosis osteoarthritis right knee with torn medial meniscus Postop diagnosis same Procedure arthroscopy right knee partial medial meniscectomy Surgeon Romeo Apple Anesthesia Gen., LMA Assistants none Operative findings tear of the posterior horn of the medial meniscus, moderate synovitis, moderate arthritis. 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 Complications none, counts were correct Details of procedure the patient was identified as [name] the surgical site was marked in the chart was updated consent was signed. The patient was taken to the operating room given 1 g of Ancef and then general anesthesia was admitted via LMA technique.  The right leg was placed in an arthroscopic leg holder and prepped and draped sterilely. Timeout procedure was completed  A lateral incision was made lateral to the patellar tendon and the scope was placed into the joint. Diagnostic arthroscopy was commenced. After thorough tour of the knee a medial portal was established a probe was placed into the joint and the meniscal tear on the medial side was identified and characterized as a degenerative posterior horn tear. We also noted in the medial compartment grade 2-3 changes of the medial femoral condyle with significant synovitis noted.  A shaver was placed into the joint to debride the meniscal tear and then a duckbill forceps was used to continue resection of the torn tissue. The meniscal fragments were removed with a motorized shaver and the meniscus was balanced with the same shaver. An ArthroCare wand 90 was placed into the joint to control bleeding.  Limited debridement was performed mainly for visualization.  A probe was placed back into the joint to confirm a stable rim of meniscal tissue remaining.  The joint was irrigated and closed with 3-0 nylon suture. 60 cc of Marcaine was injected into the  joint with epinephrine.  After sterile dressings were applied a Cryo/Cuff was placed and activated  The patient was extubated and taken to the recovery room in stable condition  Postoperative plan is for the patient to be discharged home, useful weightbearing. He will followup on Monday the 13th. We will order physical therapy at that time. He is discharged with Norco 7.5 mg for pain.

## 2010-11-02 NOTE — Anesthesia Preprocedure Evaluation (Addendum)
Anesthesia Evaluation  Name, MR# and DOB Patient awake  General Assessment Comment  Reviewed: Allergy & Precautions, H&P , NPO status , Patient's Chart, lab work & pertinent test results  History of Anesthesia Complications Negative for: history of anesthetic complications  Airway Mallampati: II TM Distance: >3 FB Neck ROM: Full    Dental No notable dental hx.    Pulmonary    pulmonary exam normalPulmonary Exam Normal     Cardiovascular hypertension, Pt. on medications Regular Normal    Neuro/Psych Negative Neurological ROS  Negative Psych ROS  GI/Hepatic/Renal negative GI ROS, negative Liver ROS, and negative Renal ROS (+)       Endo/Other  Negative Endocrine ROS (+)      Abdominal Normal abdominal exam  (+)   Musculoskeletal negative musculoskeletal ROS (+)   Hematology negative hematology ROS (+)   Peds  Reproductive/Obstetrics    Anesthesia Other Findings             Anesthesia Physical Anesthesia Plan  ASA: II  Anesthesia Plan: General   Post-op Pain Management:    Induction: Intravenous  Airway Management Planned: LMA  Additional Equipment:   Intra-op Plan:   Post-operative Plan: Extubation in OR  Informed Consent: I have reviewed the patients History and Physical, chart, labs and discussed the procedure including the risks, benefits and alternatives for the proposed anesthesia with the patient or authorized representative who has indicated his/her understanding and acceptance.     Plan Discussed with: CRNA  Anesthesia Plan Comments:         Anesthesia Quick Evaluation

## 2010-11-05 ENCOUNTER — Ambulatory Visit (INDEPENDENT_AMBULATORY_CARE_PROVIDER_SITE_OTHER): Payer: Medicare Other | Admitting: Orthopedic Surgery

## 2010-11-05 DIAGNOSIS — Z9889 Other specified postprocedural states: Secondary | ICD-10-CM

## 2010-11-05 DIAGNOSIS — M1710 Unilateral primary osteoarthritis, unspecified knee: Secondary | ICD-10-CM

## 2010-11-05 DIAGNOSIS — M23329 Other meniscus derangements, posterior horn of medial meniscus, unspecified knee: Secondary | ICD-10-CM | POA: Insufficient documentation

## 2010-11-05 NOTE — Patient Instructions (Addendum)
  Come back in 3 weeks

## 2010-11-05 NOTE — Progress Notes (Signed)
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  Current knee range of motion 115  Appearance of incision Normal  Patient complaints No complaints wants to do therapy at home  Plan Instructions for knee exercises follow-up 3 weeks

## 2010-11-09 ENCOUNTER — Telehealth: Payer: Self-pay | Admitting: *Deleted

## 2010-11-09 NOTE — Telephone Encounter (Signed)
Patient is having pain and swelling of post op knee, advised to use Ibuprofen which he has not been taking, use pain med, ice, stay off of leg today, use ace wrap, and if not better by tomorrow go to er.

## 2010-11-27 ENCOUNTER — Ambulatory Visit (INDEPENDENT_AMBULATORY_CARE_PROVIDER_SITE_OTHER): Payer: Medicare Other | Admitting: Orthopedic Surgery

## 2010-11-27 DIAGNOSIS — IMO0002 Reserved for concepts with insufficient information to code with codable children: Secondary | ICD-10-CM

## 2010-11-27 DIAGNOSIS — M23329 Other meniscus derangements, posterior horn of medial meniscus, unspecified knee: Secondary | ICD-10-CM

## 2010-11-27 DIAGNOSIS — Z9889 Other specified postprocedural states: Secondary | ICD-10-CM

## 2010-11-27 DIAGNOSIS — M1710 Unilateral primary osteoarthritis, unspecified knee: Secondary | ICD-10-CM

## 2010-11-27 NOTE — Progress Notes (Signed)
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  Current knee range of motion 125  Appearance of incision Normal  Patient complaints No complaints , some swelling. Yesterday, he had to get up under a lot more to fix it and the knees were swollen.  Plan Instructions Ice and rest for a couple of days. Come back in one month

## 2010-11-27 NOTE — Patient Instructions (Signed)
Use ice next 48 hours   Take it easy   Return in 1 month

## 2010-12-27 ENCOUNTER — Encounter: Payer: Self-pay | Admitting: Orthopedic Surgery

## 2010-12-27 ENCOUNTER — Ambulatory Visit (INDEPENDENT_AMBULATORY_CARE_PROVIDER_SITE_OTHER): Payer: Medicare Other | Admitting: Orthopedic Surgery

## 2010-12-27 DIAGNOSIS — M23329 Other meniscus derangements, posterior horn of medial meniscus, unspecified knee: Secondary | ICD-10-CM

## 2010-12-27 DIAGNOSIS — Z9889 Other specified postprocedural states: Secondary | ICD-10-CM

## 2010-12-27 NOTE — Progress Notes (Signed)
RIGHT knee arthroscopy partial meniscectomy followup visit  Doing well.  Exam shows a trace amount of joint fluid but full range of motion normal straight leg raise no extensor lag no real complaints  Follow up as needed resume normal activity

## 2010-12-27 NOTE — Patient Instructions (Signed)
Apply ice as needed

## 2011-10-02 ENCOUNTER — Ambulatory Visit (INDEPENDENT_AMBULATORY_CARE_PROVIDER_SITE_OTHER): Payer: Medicare Other

## 2011-10-02 ENCOUNTER — Telehealth: Payer: Self-pay | Admitting: Orthopedic Surgery

## 2011-10-02 ENCOUNTER — Encounter: Payer: Self-pay | Admitting: Orthopedic Surgery

## 2011-10-02 ENCOUNTER — Ambulatory Visit (INDEPENDENT_AMBULATORY_CARE_PROVIDER_SITE_OTHER): Payer: Medicare Other | Admitting: Orthopedic Surgery

## 2011-10-02 VITALS — BP 120/84 | Ht 71.0 in | Wt 244.0 lb

## 2011-10-02 DIAGNOSIS — M25569 Pain in unspecified knee: Secondary | ICD-10-CM

## 2011-10-02 DIAGNOSIS — M1710 Unilateral primary osteoarthritis, unspecified knee: Secondary | ICD-10-CM

## 2011-10-02 MED ORDER — HYDROCODONE-ACETAMINOPHEN 5-325 MG PO TABS: 1.0000 | ORAL_TABLET | Freq: Four times a day (QID) | ORAL | Status: AC | PRN

## 2011-10-02 MED ORDER — DICLOFENAC SODIUM 1 % TD GEL: 1.0000 "application " | Freq: Four times a day (QID) | TRANSDERMAL | Status: DC

## 2011-10-02 NOTE — Progress Notes (Signed)
Subjective:     Patient ID: [name], male   DOB: September 02, 1940, 71 y.o.   MRN: [id number]  Chief Complaint  Patient presents with  . Knee Pain    Right knee pain, DOS 11/02/10    Knee Pain   right knee pain swelling loss of motion    Review of Systems Neg     Objective:   Physical Exam Physical Exam(6) GENERAL: normal development   CDV: pulses are normal   Skin: normal  Psychiatric: awake, alert and oriented  Neuro: normal sensation  MSK using a cane  1 125 flexion  2 no effusion  3 neg mcmurrays  4 stable ligaments      Assessment:     Knee OA , RIGHT     Plan:     INJECT, ICE, VICODIN

## 2011-10-02 NOTE — Patient Instructions (Addendum)
You have received a steroid shot. 15% of patients experience increased pain at the injection site with in the next 24 hours. This is best treated with ice and tylenol extra strength 2 tabs every 8 hours. If you are still having pain please call the office.    

## 2011-10-02 NOTE — Telephone Encounter (Signed)
Med sent.

## 2011-10-02 NOTE — Addendum Note (Signed)
Addended by: Adella Hare B on: 10/02/2011 11:55 AM   Modules accepted: Orders

## 2011-10-02 NOTE — Telephone Encounter (Signed)
After patient was seen today, he has a question about another medication that was discussed at his visit.  Asking if he was to get Rx for Voltaren gel?  His pharmacy is [facility] in [place].  His ph# is  Home [phone] or Cell [phone].

## 2011-10-03 NOTE — Telephone Encounter (Signed)
Called back to patient; left voice msg at both home + cell #'s.

## 2012-08-07 ENCOUNTER — Encounter (HOSPITAL_COMMUNITY): Payer: Self-pay | Admitting: Emergency Medicine

## 2012-08-07 ENCOUNTER — Emergency Department (HOSPITAL_COMMUNITY)
Admission: EM | Admit: 2012-08-07 | Discharge: 2012-08-07 | Discharge status: Against Medical Advice | Payer: Medicare Other | Attending: Emergency Medicine | Admitting: Emergency Medicine

## 2012-08-07 DIAGNOSIS — R61 Generalized hyperhidrosis: Secondary | ICD-10-CM | POA: Insufficient documentation

## 2012-08-07 DIAGNOSIS — R55 Syncope and collapse: Secondary | ICD-10-CM | POA: Insufficient documentation

## 2012-08-07 DIAGNOSIS — Z87828 Personal history of other (healed) physical injury and trauma: Secondary | ICD-10-CM | POA: Insufficient documentation

## 2012-08-07 DIAGNOSIS — R5381 Other malaise: Secondary | ICD-10-CM | POA: Insufficient documentation

## 2012-08-07 DIAGNOSIS — K922 Gastrointestinal hemorrhage, unspecified: Secondary | ICD-10-CM | POA: Diagnosis present

## 2012-08-07 DIAGNOSIS — M23329 Other meniscus derangements, posterior horn of medial meniscus, unspecified knee: Secondary | ICD-10-CM

## 2012-08-07 DIAGNOSIS — Z7982 Long term (current) use of aspirin: Secondary | ICD-10-CM | POA: Insufficient documentation

## 2012-08-07 DIAGNOSIS — M179 Osteoarthritis of knee, unspecified: Secondary | ICD-10-CM | POA: Diagnosis present

## 2012-08-07 DIAGNOSIS — K625 Hemorrhage of anus and rectum: Secondary | ICD-10-CM

## 2012-08-07 DIAGNOSIS — I10 Essential (primary) hypertension: Secondary | ICD-10-CM | POA: Diagnosis present

## 2012-08-07 DIAGNOSIS — M1710 Unilateral primary osteoarthritis, unspecified knee: Secondary | ICD-10-CM

## 2012-08-07 DIAGNOSIS — K644 Residual hemorrhoidal skin tags: Secondary | ICD-10-CM | POA: Insufficient documentation

## 2012-08-07 DIAGNOSIS — F17200 Nicotine dependence, unspecified, uncomplicated: Secondary | ICD-10-CM | POA: Insufficient documentation

## 2012-08-07 DIAGNOSIS — Z79899 Other long term (current) drug therapy: Secondary | ICD-10-CM | POA: Insufficient documentation

## 2012-08-07 LAB — COMPREHENSIVE METABOLIC PANEL
ALT: 11 U/L (ref 0–53)
Albumin: 3.9 g/dL (ref 3.5–5.2)
Alkaline Phosphatase: 42 U/L (ref 39–117)
BUN: 9 mg/dL (ref 6–23)
Chloride: 98 mEq/L (ref 96–112)
Potassium: 4.1 mEq/L (ref 3.5–5.1)
Sodium: 135 mEq/L (ref 135–145)
Total Bilirubin: 0.4 mg/dL (ref 0.3–1.2)

## 2012-08-07 LAB — PROTIME-INR
INR: 0.95 (ref 0.00–1.49)
Prothrombin Time: 12.6 seconds (ref 11.6–15.2)

## 2012-08-07 LAB — CBC WITH DIFFERENTIAL/PLATELET
Basophils Relative: 0 % (ref 0–1)
Hemoglobin: 15.7 g/dL (ref 13.0–17.0)
Lymphs Abs: 0.9 10*3/uL (ref 0.7–4.0)
MCHC: 34.6 g/dL (ref 30.0–36.0)
Monocytes Relative: 6 % (ref 3–12)
Neutro Abs: 7.4 10*3/uL (ref 1.7–7.7)
Neutrophils Relative %: 83 % — ABNORMAL HIGH (ref 43–77)
Platelets: 198 10*3/uL (ref 150–400)
RBC: 4.47 MIL/uL (ref 4.22–5.81)

## 2012-08-07 LAB — TYPE AND SCREEN: ABO/RH(D): O POS

## 2012-08-07 LAB — LACTIC ACID, PLASMA: Lactic Acid, Venous: 1.4 mmol/L (ref 0.5–2.2)

## 2012-08-07 LAB — APTT: aPTT: 32 seconds (ref 24–37)

## 2012-08-07 MED ORDER — SODIUM CHLORIDE 0.9 % IV SOLN
80.0000 mg | Freq: Once | INTRAVENOUS | Status: DC
  Filled 2012-08-07: qty 80

## 2012-08-07 MED ORDER — SODIUM CHLORIDE 0.9 % IV SOLN
1000.0000 mL | INTRAVENOUS | Status: DC
  Administered 2012-08-07: 1000 mL via INTRAVENOUS

## 2012-08-07 MED ORDER — SODIUM CHLORIDE 0.9 % IV SOLN
8.0000 mg/h | INTRAVENOUS | Status: DC
  Filled 2012-08-07 (×3): qty 80

## 2012-08-07 MED ORDER — SODIUM CHLORIDE 0.9 % IV SOLN
1000.0000 mL | Freq: Once | INTRAVENOUS | Status: AC
  Administered 2012-08-07: 1000 mL via INTRAVENOUS
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
## 2012-08-07 NOTE — ED Notes (Signed)
Pt requesting to leave AMA, refusing to be admitted.  Signed AMA form and verbalized understanding.  nad noted upon discharge.

## 2012-08-07 NOTE — ED Notes (Signed)
Pt states feel weak, got out of bed and fell due to weakness(wife states he has questionable convulsion). Pt went to bathroom and was having BRB rectal bleeding.
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
## 2013-11-23 ENCOUNTER — Other Ambulatory Visit (HOSPITAL_COMMUNITY): Payer: Self-pay | Admitting: Internal Medicine

## 2013-11-30 ENCOUNTER — Other Ambulatory Visit (HOSPITAL_COMMUNITY): Payer: Self-pay | Admitting: Internal Medicine

## 2013-11-30 DIAGNOSIS — F17200 Nicotine dependence, unspecified, uncomplicated: Secondary | ICD-10-CM

## 2013-11-30 DIAGNOSIS — E785 Hyperlipidemia, unspecified: Secondary | ICD-10-CM

## 2013-11-30 DIAGNOSIS — I10 Essential (primary) hypertension: Secondary | ICD-10-CM

## 2013-12-09 ENCOUNTER — Ambulatory Visit (HOSPITAL_COMMUNITY)
Admission: RE | Admit: 2013-12-09 | Discharge: 2013-12-09 | Disposition: A | Discharge status: Home / Self Care | Payer: Medicare Other | Source: Ambulatory Visit | Attending: Internal Medicine | Admitting: Internal Medicine

## 2013-12-09 DIAGNOSIS — F17200 Nicotine dependence, unspecified, uncomplicated: Secondary | ICD-10-CM

## 2013-12-09 DIAGNOSIS — I10 Essential (primary) hypertension: Secondary | ICD-10-CM

## 2013-12-09 DIAGNOSIS — E785 Hyperlipidemia, unspecified: Secondary | ICD-10-CM

## 2013-12-13 ENCOUNTER — Other Ambulatory Visit: Payer: Self-pay | Admitting: *Deleted

## 2013-12-13 DIAGNOSIS — I739 Peripheral vascular disease, unspecified: Secondary | ICD-10-CM

## 2013-12-14 ENCOUNTER — Encounter: Payer: Self-pay | Admitting: Vascular Surgery

## 2013-12-15 ENCOUNTER — Ambulatory Visit (HOSPITAL_COMMUNITY)
Admission: RE | Admit: 2013-12-15 | Discharge: 2013-12-15 | Disposition: A | Discharge status: Home / Self Care | Payer: Medicare Other | Source: Ambulatory Visit | Attending: Vascular Surgery | Admitting: Vascular Surgery

## 2013-12-15 ENCOUNTER — Encounter: Payer: Self-pay | Admitting: Vascular Surgery

## 2013-12-15 ENCOUNTER — Ambulatory Visit (INDEPENDENT_AMBULATORY_CARE_PROVIDER_SITE_OTHER): Payer: Medicare Other | Admitting: Vascular Surgery

## 2013-12-15 VITALS — BP 158/85 | HR 80 | Ht 71.0 in | Wt 226.0 lb

## 2013-12-15 DIAGNOSIS — I739 Peripheral vascular disease, unspecified: Secondary | ICD-10-CM | POA: Insufficient documentation

## 2013-12-15 NOTE — Addendum Note (Signed)
Addended by: Peter Minium K on: 12/15/2013 04:00 PM   Modules accepted: Orders
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
## 2013-12-15 NOTE — Assessment & Plan Note (Signed)
Based on his exam and Doppler study, he has evidence of infrainguinal arterial occlusive disease on the left. This appears to be in the popliteal artery at the level of the knee and therefore she likely not approachable with angioplasty and stenting. However, given that the patient has minimal symptoms I would not recommend an aggressive approach to this regardless. If he developed rest pain or nonhealing ulcer and we could consider arteriography and revascularization. However currently has minimal symptoms. We have discussed the importance of tobacco cessation. I've encouraged him to stay as active as possible. I've ordered follow up ABIs in one year now see him back at that time. He knows to call sooner for has problems.

## 2014-12-01 DIAGNOSIS — I739 Peripheral vascular disease, unspecified: Secondary | ICD-10-CM | POA: Diagnosis not present

## 2014-12-01 DIAGNOSIS — Z1389 Encounter for screening for other disorder: Secondary | ICD-10-CM | POA: Diagnosis not present

## 2014-12-01 DIAGNOSIS — E063 Autoimmune thyroiditis: Secondary | ICD-10-CM | POA: Diagnosis not present

## 2014-12-01 DIAGNOSIS — I10 Essential (primary) hypertension: Secondary | ICD-10-CM | POA: Diagnosis not present

## 2014-12-21 ENCOUNTER — Encounter (HOSPITAL_COMMUNITY): Payer: Self-pay

## 2014-12-21 ENCOUNTER — Ambulatory Visit: Payer: Medicare Other | Admitting: Vascular Surgery

## 2015-01-02 DIAGNOSIS — R3129 Other microscopic hematuria: Secondary | ICD-10-CM | POA: Diagnosis not present

## 2015-01-02 DIAGNOSIS — N342 Other urethritis: Secondary | ICD-10-CM | POA: Diagnosis not present

## 2015-01-02 DIAGNOSIS — Z1389 Encounter for screening for other disorder: Secondary | ICD-10-CM | POA: Diagnosis not present

## 2015-01-02 DIAGNOSIS — R311 Benign essential microscopic hematuria: Secondary | ICD-10-CM | POA: Diagnosis not present

## 2015-01-03 ENCOUNTER — Encounter: Payer: Self-pay | Admitting: Family

## 2015-01-04 ENCOUNTER — Ambulatory Visit (HOSPITAL_COMMUNITY)
Admission: RE | Admit: 2015-01-04 | Discharge: 2015-01-04 | Disposition: A | Discharge status: Home / Self Care | Payer: Medicare Other | Source: Ambulatory Visit | Attending: Family | Admitting: Family

## 2015-01-04 ENCOUNTER — Ambulatory Visit (INDEPENDENT_AMBULATORY_CARE_PROVIDER_SITE_OTHER): Payer: Medicare Other | Admitting: Family

## 2015-01-04 ENCOUNTER — Encounter: Payer: Self-pay | Admitting: Family

## 2015-01-04 VITALS — BP 150/89 | HR 77 | Ht 71.0 in | Wt 228.7 lb

## 2015-01-04 DIAGNOSIS — Z720 Tobacco use: Secondary | ICD-10-CM

## 2015-01-04 DIAGNOSIS — F17200 Nicotine dependence, unspecified, uncomplicated: Secondary | ICD-10-CM

## 2015-01-04 DIAGNOSIS — I10 Essential (primary) hypertension: Secondary | ICD-10-CM | POA: Diagnosis not present

## 2015-01-04 DIAGNOSIS — I739 Peripheral vascular disease, unspecified: Secondary | ICD-10-CM | POA: Diagnosis not present

## 2015-01-04 DIAGNOSIS — I70212 Atherosclerosis of native arteries of extremities with intermittent claudication, left leg: Secondary | ICD-10-CM
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
## 2015-01-04 NOTE — Patient Instructions (Addendum)
Peripheral Vascular Disease Peripheral vascular disease (PVD) is a disease of the blood vessels that are not part of your heart and brain. A simple term for PVD is poor circulation. In most cases, PVD narrows the blood vessels that carry blood from your heart to the rest of your body. This can result in a decreased supply of blood to your arms, legs, and internal organs, like your stomach or kidneys. However, it most often affects a person's lower legs and feet. There are two types of PVD.  Organic PVD. This is the more common type. It is caused by damage to the structure of blood vessels.  Functional PVD. This is caused by conditions that make blood vessels contract and tighten (spasm). Without treatment, PVD tends to get worse over time. PVD can also lead to acute ischemic limb. This is when an arm or limb suddenly has trouble getting enough blood. This is a medical emergency. CAUSES Each type of PVD has many different causes. The most common cause of PVD is buildup of a fatty material (plaque) inside of your arteries (atherosclerosis). Small amounts of plaque can break off from the walls of the blood vessels and become lodged in a smaller artery. This blocks blood flow and can cause acute ischemic limb. Other common causes of PVD include:  Blood clots that form inside of blood vessels.  Injuries to blood vessels.  Diseases that cause inflammation of blood vessels or cause blood vessel spasms.  Health behaviors and health history that increase your risk of developing PVD. RISK FACTORS  You may have a greater risk of PVD if you:  Have a family history of PVD.  Have certain medical conditions, including:  High cholesterol.  Diabetes.  High blood pressure (hypertension).  Coronary heart disease.  Past problems with blood clots.  Past injury, such as burns or a broken bone. These may have damaged blood vessels in your limbs.  Buerger disease. This is caused by inflamed blood  vessels in your hands and feet.  Some forms of arthritis.  Rare birth defects that affect the arteries in your legs.  Use tobacco.  Do not get enough exercise.  Are obese.  Are age 50 or older. SIGNS AND SYMPTOMS  PVD may cause many different symptoms. Your symptoms depend on what part of your body is not getting enough blood. Some common signs and symptoms include:  Cramps in your lower legs. This may be a symptom of poor leg circulation (claudication).  Pain and weakness in your legs while you are physically active that goes away when you rest (intermittent claudication).  Leg pain when at rest.  Leg numbness, tingling, or weakness.  Coldness in a leg or foot, especially when compared with the other leg.  Skin or hair changes. These can include:  Hair loss.  Shiny skin.  Pale or bluish skin.  Thick toenails.  Inability to get or maintain an erection (erectile dysfunction). People with PVD are more prone to developing ulcers and sores on their toes, feet, or legs. These may take longer than normal to heal. DIAGNOSIS Your health care provider may diagnose PVD from your signs and symptoms. The health care provider will also do a physical exam. You may have tests to find out what is causing your PVD and determine its severity. Tests may include:  Blood pressure recordings from your arms and legs and measurements of the strength of your pulses (pulse volume recordings).  Imaging studies using sound waves to take pictures of   the blood flow through your blood vessels (Doppler ultrasound).  Injecting a dye into your blood vessels before having imaging studies using:  X-rays (angiogram or arteriogram).  Computer-generated X-rays (CT angiogram).  A powerful electromagnetic field and a computer (magnetic resonance angiogram or MRA). TREATMENT Treatment for PVD depends on the cause of your condition and the severity of your symptoms. It also depends on your age. Underlying  causes need to be treated and controlled. These include long-lasting (chronic) conditions, such as diabetes, high cholesterol, and high blood pressure. You may need to first try making lifestyle changes and taking medicines. Surgery may be needed if these do not work. Lifestyle changes may include:  Quitting smoking.  Exercising regularly.  Following a low-fat, low-cholesterol diet. Medicines may include:  Blood thinners to prevent blood clots.  Medicines to improve blood flow.  Medicines to improve your blood cholesterol levels. Surgical procedures may include:  A procedure that uses an inflated balloon to open a blocked artery and improve blood flow (angioplasty).  A procedure to put in a tube (stent) to keep a blocked artery open (stent implant).  Surgery to reroute blood flow around a blocked artery (peripheral bypass surgery).  Surgery to remove dead tissue from an infected wound on the affected limb.  Amputation. This is surgical removal of the affected limb. This may be necessary in cases of acute ischemic limb that are not improved through medical or surgical treatments. HOME CARE INSTRUCTIONS  Take medicines only as directed by your health care provider.  Do not use any tobacco products, including cigarettes, chewing tobacco, or electronic cigarettes. If you need help quitting, ask your health care provider.  Lose weight if you are overweight, and maintain a healthy weight as directed by your health care provider.  Eat a diet that is low in fat and cholesterol. If you need help, ask your health care provider.  Exercise regularly. Ask your health care provider to suggest some good activities for you.  Use compression stockings or other mechanical devices as directed by your health care provider.  Take good care of your feet.  Wear comfortable shoes that fit well.  Check your feet often for any cuts or sores. SEEK MEDICAL CARE IF:  You have cramps in your legs  while walking.  You have leg pain when you are at rest.  You have coldness in a leg or foot.  Your skin changes.  You have erectile dysfunction.  You have cuts or sores on your feet that are not healing. SEEK IMMEDIATE MEDICAL CARE IF:  Your arm or leg turns cold and blue.  Your arms or legs become red, warm, swollen, painful, or numb.  You have chest pain or trouble breathing.  You suddenly have weakness in your face, arm, or leg.  You become very confused or lose the ability to speak.  You suddenly have a very bad headache or lose your vision.   This information is not intended to replace advice given to you by your health care provider. Make sure you discuss any questions you have with your health care provider.   Document Released: 04/18/2004 Document Revised: 04/01/2014 Document Reviewed: 08/19/2013 Elsevier Interactive Patient Education 2016 Else[employer]     Smoking Cessation, Tips for Success If you are ready to quit smoking, congratulations! You have chosen to help yourself be healthier. Cigarettes bring nicotine, tar, carbon monoxide, and other irritants into your body. Your lungs, heart, and blood vessels will be able to work better without   these poisons. There are many different ways to quit smoking. Nicotine gum, nicotine patches, a nicotine inhaler, or nicotine nasal spray can help with physical craving. Hypnosis, support groups, and medicines help break the habit of smoking. WHAT THINGS CAN I DO TO MAKE QUITTING EASIER?  Here are some tips to help you quit for good:  Pick a date when you will quit smoking completely. Tell all of your friends and family about your plan to quit on that date.  Do not try to slowly cut down on the number of cigarettes you are smoking. Pick a quit date and quit smoking completely starting on that day.  Throw away all cigarettes.   Clean and remove all ashtrays from your home, work, and car.  On a card, write down your reasons  for quitting. Carry the card with you and read it when you get the urge to smoke.  Cleanse your body of nicotine. Drink enough water and fluids to keep your urine clear or pale yellow. Do this after quitting to flush the nicotine from your body.  Learn to predict your moods. Do not let a bad situation be your excuse to have a cigarette. Some situations in your life might tempt you into wanting a cigarette.  Never have "just one" cigarette. It leads to wanting another and another. Remind yourself of your decision to quit.  Change habits associated with smoking. If you smoked while driving or when feeling stressed, try other activities to replace smoking. Stand up when drinking your coffee. Brush your teeth after eating. Sit in a different chair when you read the paper. Avoid alcohol while trying to quit, and try to drink fewer caffeinated beverages. Alcohol and caffeine may urge you to smoke.  Avoid foods and drinks that can trigger a desire to smoke, such as sugary or spicy foods and alcohol.  Ask people who smoke not to smoke around you.  Have something planned to do right after eating or having a cup of coffee. For example, plan to take a walk or exercise.  Try a relaxation exercise to calm you down and decrease your stress. Remember, you may be tense and nervous for the first 2 weeks after you quit, but this will pass.  Find new activities to keep your hands busy. Play with a pen, coin, or rubber band. Doodle or draw things on paper.  Brush your teeth right after eating. This will help cut down on the craving for the taste of tobacco after meals. You can also try mouthwash.   Use oral substitutes in place of cigarettes. Try using lemon drops, carrots, cinnamon sticks, or chewing gum. Keep them handy so they are available when you have the urge to smoke.  When you have the urge to smoke, try deep breathing.  Designate your home as a nonsmoking area.  If you are a heavy smoker, ask your  health care provider about a prescription for nicotine chewing gum. It can ease your withdrawal from nicotine.  Reward yourself. Set aside the cigarette money you save and buy yourself something nice.  Look for support from others. Join a support group or smoking cessation program. Ask someone at home or at work to help you with your plan to quit smoking.  Always ask yourself, "Do I need this cigarette or is this just a reflex?" Tell yourself, "Today, I choose not to smoke," or "I do not want to smoke." You are reminding yourself of your decision to quit.  Do not   cigarette smoking with electronic cigarettes (commonly called e-cigarettes). The safety of e-cigarettes is unknown, and some may contain harmful chemicals.  If you relapse, do not give up! Plan ahead and think about what you will do the next time you get the urge to smoke. HOW WILL I FEEL WHEN I QUIT SMOKING? You may have symptoms of withdrawal because your body is used to nicotine (the addictive substance in cigarettes). You may crave cigarettes, be irritable, feel very hungry, cough often, get headaches, or have difficulty concentrating. The withdrawal symptoms are only temporary. They are strongest when you first quit but will go away within 10-14 days. When withdrawal symptoms occur, stay in control. Think about your reasons for quitting. Remind yourself that these are signs that your body is healing and getting used to being without cigarettes. Remember that withdrawal symptoms are easier to treat than the major diseases that smoking can cause.  Even after the withdrawal is over, expect periodic urges to smoke. However, these cravings are generally short lived and will go away whether you smoke or not. Do not smoke! WHAT RESOURCES ARE AVAILABLE TO HELP ME QUIT SMOKING? Your health care provider can direct you to community resources or hospitals for support, which may include:  Group  support.  Education.  Hypnosis.  Therapy.   This information is not intended to replace advice given to you by your health care provider. Make sure you discuss any questions you have with your health care provider.   Document Released: 12/08/2003 Document Revised: 04/01/2014 Document Reviewed: 08/27/2012 Elsevier Interactive Patient Education 2016 [place] Can Quit Smoking If you are ready to quit smoking or are thinking about it, congratulations! You have chosen to help yourself be healthier and live longer! There are lots of different ways to quit smoking. Nicotine gum, nicotine patches, a nicotine inhaler, or nicotine nasal spray can help with physical craving. Hypnosis, support groups, and medicines help break the habit of smoking. TIPS TO GET OFF AND STAY OFF CIGARETTES  Learn to predict your moods. Do not let a bad situation be your excuse to have a cigarette. Some situations in your life might tempt you to have a cigarette.  Ask friends and co-workers not to smoke around you.  Make your home smoke-free.  Never have "just one" cigarette. It leads to wanting another and another. Remind yourself of your decision to quit.  On a card, make a list of your reasons for not smoking. Read it at least the same number of times a day as you have a cigarette. Tell yourself everyday, "I do not want to smoke. I choose not to smoke."  Ask someone at home or work to help you with your plan to quit smoking.  Have something planned after you eat or have a cup of coffee. Take a walk or get other exercise to perk you up. This will help to keep you from overeating.  Try a relaxation exercise to calm you down and decrease your stress. Remember, you may be tense and nervous the first two weeks after you quit. This will pass.  Find new activities to keep your hands busy. Play with a pen, coin, or rubber band. Doodle or draw things on paper.  Brush your teeth right after eating. This  will help cut down the craving for the taste of tobacco after meals. You can try mouthwash too.  Try gum, breath mints, or diet candy to keep something in your mouth. IF YOU  SMOKE AND WANT TO QUIT:  Do not stock up on cigarettes. Never buy a carton. Wait until one pack is finished before you buy another.  Never carry cigarettes with you at work or at home.  Keep cigarettes as far away from you as possible. Leave them with someone else.  Never carry matches or a lighter with you.  Ask yourself, "Do I need this cigarette or is this just a reflex?"  Bet with someone that you can quit. Put cigarette money in a piggy bank every morning. If you smoke, you give up the money. If you do not smoke, by the end of the week, you keep the money.  Keep trying. It takes 21 days to change a habit!  Talk to your doctor about using medicines to help you quit. These include nicotine replacement gum, lozenges, or skin patches.   This information is not intended to replace advice given to you by your health care provider. Make sure you discuss any questions you have with your health care provider.   Document Released: 01/05/2009 Document Revised: 06/03/2011 Document Reviewed: 01/05/2009 Elsevier Interactive Patient Education [employer].

## 2015-02-09 DIAGNOSIS — I739 Peripheral vascular disease, unspecified: Secondary | ICD-10-CM | POA: Diagnosis not present

## 2015-02-09 DIAGNOSIS — Z1389 Encounter for screening for other disorder: Secondary | ICD-10-CM | POA: Diagnosis not present

## 2015-02-09 DIAGNOSIS — Z0001 Encounter for general adult medical examination with abnormal findings: Secondary | ICD-10-CM | POA: Diagnosis not present

## 2015-02-09 DIAGNOSIS — Z0000 Encounter for general adult medical examination without abnormal findings: Secondary | ICD-10-CM | POA: Diagnosis not present

## 2015-02-09 DIAGNOSIS — N183 Chronic kidney disease, stage 3 (moderate): Secondary | ICD-10-CM | POA: Diagnosis not present

## 2015-03-28 DIAGNOSIS — E039 Hypothyroidism, unspecified: Secondary | ICD-10-CM | POA: Diagnosis not present

## 2015-04-19 DIAGNOSIS — R319 Hematuria, unspecified: Secondary | ICD-10-CM | POA: Diagnosis not present

## 2015-04-19 DIAGNOSIS — N342 Other urethritis: Secondary | ICD-10-CM | POA: Diagnosis not present

## 2015-04-19 DIAGNOSIS — Z1389 Encounter for screening for other disorder: Secondary | ICD-10-CM | POA: Diagnosis not present

## 2015-04-26 DIAGNOSIS — R319 Hematuria, unspecified: Secondary | ICD-10-CM | POA: Diagnosis not present

## 2016-01-10 ENCOUNTER — Encounter (HOSPITAL_COMMUNITY): Payer: Medicare Other

## 2016-01-10 ENCOUNTER — Ambulatory Visit: Payer: Medicare Other | Admitting: Family

## 2016-02-19 DIAGNOSIS — Z1389 Encounter for screening for other disorder: Secondary | ICD-10-CM | POA: Diagnosis not present

## 2016-02-19 DIAGNOSIS — E063 Autoimmune thyroiditis: Secondary | ICD-10-CM | POA: Diagnosis not present

## 2016-02-19 DIAGNOSIS — Z23 Encounter for immunization: Secondary | ICD-10-CM | POA: Diagnosis not present

## 2016-02-19 DIAGNOSIS — I10 Essential (primary) hypertension: Secondary | ICD-10-CM | POA: Diagnosis not present

## 2016-02-19 DIAGNOSIS — I739 Peripheral vascular disease, unspecified: Secondary | ICD-10-CM | POA: Diagnosis not present

## 2016-08-29 DIAGNOSIS — X32XXXD Exposure to sunlight, subsequent encounter: Secondary | ICD-10-CM | POA: Diagnosis not present

## 2016-08-29 DIAGNOSIS — L818 Other specified disorders of pigmentation: Secondary | ICD-10-CM | POA: Diagnosis not present

## 2016-08-29 DIAGNOSIS — L820 Inflamed seborrheic keratosis: Secondary | ICD-10-CM | POA: Diagnosis not present

## 2016-08-29 DIAGNOSIS — L570 Actinic keratosis: Secondary | ICD-10-CM | POA: Diagnosis not present

## 2016-08-29 DIAGNOSIS — D225 Melanocytic nevi of trunk: Secondary | ICD-10-CM | POA: Diagnosis not present

## 2017-02-06 DIAGNOSIS — Z1389 Encounter for screening for other disorder: Secondary | ICD-10-CM | POA: Diagnosis not present

## 2017-02-06 DIAGNOSIS — E039 Hypothyroidism, unspecified: Secondary | ICD-10-CM | POA: Diagnosis not present

## 2017-02-06 DIAGNOSIS — Z0000 Encounter for general adult medical examination without abnormal findings: Secondary | ICD-10-CM | POA: Diagnosis not present

## 2017-02-10 DIAGNOSIS — R7301 Impaired fasting glucose: Secondary | ICD-10-CM | POA: Diagnosis not present

## 2017-02-10 DIAGNOSIS — R7309 Other abnormal glucose: Secondary | ICD-10-CM | POA: Diagnosis not present

## 2017-04-15 DIAGNOSIS — R944 Abnormal results of kidney function studies: Secondary | ICD-10-CM | POA: Diagnosis not present

## 2017-04-28 DIAGNOSIS — N189 Chronic kidney disease, unspecified: Secondary | ICD-10-CM | POA: Diagnosis not present

## 2017-04-28 DIAGNOSIS — Z720 Tobacco use: Secondary | ICD-10-CM | POA: Diagnosis not present

## 2017-04-28 DIAGNOSIS — I10 Essential (primary) hypertension: Secondary | ICD-10-CM | POA: Diagnosis not present

## 2017-04-28 DIAGNOSIS — N200 Calculus of kidney: Secondary | ICD-10-CM | POA: Diagnosis not present

## 2017-05-16 ENCOUNTER — Other Ambulatory Visit (HOSPITAL_COMMUNITY): Payer: Self-pay | Admitting: Medical

## 2017-05-16 DIAGNOSIS — N183 Chronic kidney disease, stage 3 unspecified: Secondary | ICD-10-CM

## 2017-06-03 ENCOUNTER — Ambulatory Visit (HOSPITAL_COMMUNITY)
Admission: RE | Admit: 2017-06-03 | Discharge: 2017-06-03 | Disposition: A | Discharge status: Home / Self Care | Payer: Medicare Other | Source: Ambulatory Visit | Attending: Medical | Admitting: Medical

## 2017-06-03 DIAGNOSIS — Z79899 Other long term (current) drug therapy: Secondary | ICD-10-CM | POA: Diagnosis not present

## 2017-06-03 DIAGNOSIS — Z1159 Encounter for screening for other viral diseases: Secondary | ICD-10-CM | POA: Diagnosis not present

## 2017-06-03 DIAGNOSIS — I10 Essential (primary) hypertension: Secondary | ICD-10-CM | POA: Diagnosis not present

## 2017-06-03 DIAGNOSIS — D509 Iron deficiency anemia, unspecified: Secondary | ICD-10-CM | POA: Diagnosis not present

## 2017-06-03 DIAGNOSIS — E559 Vitamin D deficiency, unspecified: Secondary | ICD-10-CM | POA: Diagnosis not present

## 2017-06-03 DIAGNOSIS — R809 Proteinuria, unspecified: Secondary | ICD-10-CM | POA: Diagnosis not present

## 2017-06-03 DIAGNOSIS — N183 Chronic kidney disease, stage 3 unspecified: Secondary | ICD-10-CM

## 2017-06-16 DIAGNOSIS — N182 Chronic kidney disease, stage 2 (mild): Secondary | ICD-10-CM | POA: Diagnosis not present

## 2017-06-16 DIAGNOSIS — I10 Essential (primary) hypertension: Secondary | ICD-10-CM | POA: Diagnosis not present

## 2017-06-16 DIAGNOSIS — R809 Proteinuria, unspecified: Secondary | ICD-10-CM | POA: Diagnosis not present

## 2017-06-16 DIAGNOSIS — N200 Calculus of kidney: Secondary | ICD-10-CM | POA: Insufficient documentation

## 2017-06-16 DIAGNOSIS — N400 Enlarged prostate without lower urinary tract symptoms: Secondary | ICD-10-CM | POA: Insufficient documentation

## 2017-06-16 DIAGNOSIS — E559 Vitamin D deficiency, unspecified: Secondary | ICD-10-CM | POA: Diagnosis not present

## 2017-07-07 DIAGNOSIS — Z1389 Encounter for screening for other disorder: Secondary | ICD-10-CM | POA: Diagnosis not present

## 2017-07-07 DIAGNOSIS — H9201 Otalgia, right ear: Secondary | ICD-10-CM | POA: Diagnosis not present

## 2017-07-07 DIAGNOSIS — L309 Dermatitis, unspecified: Secondary | ICD-10-CM | POA: Diagnosis not present

## 2017-12-05 DIAGNOSIS — R944 Abnormal results of kidney function studies: Secondary | ICD-10-CM | POA: Diagnosis not present

## 2017-12-12 DIAGNOSIS — N183 Chronic kidney disease, stage 3 (moderate): Secondary | ICD-10-CM | POA: Diagnosis not present

## 2017-12-12 DIAGNOSIS — E559 Vitamin D deficiency, unspecified: Secondary | ICD-10-CM | POA: Diagnosis not present

## 2017-12-12 DIAGNOSIS — R809 Proteinuria, unspecified: Secondary | ICD-10-CM | POA: Diagnosis not present

## 2017-12-12 DIAGNOSIS — D509 Iron deficiency anemia, unspecified: Secondary | ICD-10-CM | POA: Diagnosis not present

## 2017-12-12 DIAGNOSIS — I10 Essential (primary) hypertension: Secondary | ICD-10-CM | POA: Diagnosis not present

## 2017-12-12 DIAGNOSIS — Z79899 Other long term (current) drug therapy: Secondary | ICD-10-CM | POA: Diagnosis not present

## 2017-12-16 DIAGNOSIS — I10 Essential (primary) hypertension: Secondary | ICD-10-CM | POA: Diagnosis not present

## 2017-12-16 DIAGNOSIS — N182 Chronic kidney disease, stage 2 (mild): Secondary | ICD-10-CM | POA: Diagnosis not present

## 2017-12-16 DIAGNOSIS — E559 Vitamin D deficiency, unspecified: Secondary | ICD-10-CM | POA: Diagnosis not present

## 2017-12-16 DIAGNOSIS — R809 Proteinuria, unspecified: Secondary | ICD-10-CM | POA: Diagnosis not present

## 2017-12-16 DIAGNOSIS — E875 Hyperkalemia: Secondary | ICD-10-CM | POA: Diagnosis not present

## 2018-02-10 DIAGNOSIS — Z0001 Encounter for general adult medical examination with abnormal findings: Secondary | ICD-10-CM | POA: Diagnosis not present

## 2018-02-10 DIAGNOSIS — H6122 Impacted cerumen, left ear: Secondary | ICD-10-CM | POA: Diagnosis not present

## 2018-02-10 DIAGNOSIS — Z1389 Encounter for screening for other disorder: Secondary | ICD-10-CM | POA: Diagnosis not present

## 2018-02-10 DIAGNOSIS — E063 Autoimmune thyroiditis: Secondary | ICD-10-CM | POA: Diagnosis not present

## 2018-02-16 ENCOUNTER — Emergency Department (HOSPITAL_COMMUNITY)
Admission: EM | Admit: 2018-02-16 | Discharge: 2018-02-16 | Disposition: A | Discharge status: Home / Self Care | Payer: Medicare Other | Attending: Emergency Medicine | Admitting: Emergency Medicine

## 2018-02-16 ENCOUNTER — Emergency Department (HOSPITAL_COMMUNITY): Payer: Medicare Other

## 2018-02-16 ENCOUNTER — Encounter (HOSPITAL_COMMUNITY): Payer: Self-pay | Admitting: Emergency Medicine

## 2018-02-16 ENCOUNTER — Other Ambulatory Visit: Payer: Self-pay

## 2018-02-16 DIAGNOSIS — Y9389 Activity, other specified: Secondary | ICD-10-CM | POA: Insufficient documentation

## 2018-02-16 DIAGNOSIS — Y999 Unspecified external cause status: Secondary | ICD-10-CM | POA: Insufficient documentation

## 2018-02-16 DIAGNOSIS — W01198A Fall on same level from slipping, tripping and stumbling with subsequent striking against other object, initial encounter: Secondary | ICD-10-CM | POA: Diagnosis not present

## 2018-02-16 DIAGNOSIS — S0181XA Laceration without foreign body of other part of head, initial encounter: Secondary | ICD-10-CM | POA: Diagnosis not present

## 2018-02-16 DIAGNOSIS — Z7982 Long term (current) use of aspirin: Secondary | ICD-10-CM | POA: Insufficient documentation

## 2018-02-16 DIAGNOSIS — Y92003 Bedroom of unspecified non-institutional (private) residence as the place of occurrence of the external cause: Secondary | ICD-10-CM | POA: Insufficient documentation

## 2018-02-16 DIAGNOSIS — R221 Localized swelling, mass and lump, neck: Secondary | ICD-10-CM | POA: Diagnosis not present

## 2018-02-16 DIAGNOSIS — Z79899 Other long term (current) drug therapy: Secondary | ICD-10-CM | POA: Insufficient documentation

## 2018-02-16 DIAGNOSIS — R55 Syncope and collapse: Secondary | ICD-10-CM | POA: Diagnosis not present

## 2018-02-16 DIAGNOSIS — F17210 Nicotine dependence, cigarettes, uncomplicated: Secondary | ICD-10-CM | POA: Insufficient documentation

## 2018-02-16 DIAGNOSIS — I10 Essential (primary) hypertension: Secondary | ICD-10-CM | POA: Diagnosis not present

## 2018-02-16 LAB — CBC
HCT: 43.2 % (ref 39.0–52.0)
Hemoglobin: 14.3 g/dL (ref 13.0–17.0)
MCH: 36.6 pg — ABNORMAL HIGH (ref 26.0–34.0)
MCHC: 33.1 g/dL (ref 30.0–36.0)
MCV: 110.5 fL — ABNORMAL HIGH (ref 80.0–100.0)
Platelets: 171 10*3/uL (ref 150–400)
RBC: 3.91 MIL/uL — ABNORMAL LOW (ref 4.22–5.81)
RDW: 13.3 % (ref 11.5–15.5)
WBC: 9.1 10*3/uL (ref 4.0–10.5)
nRBC: 0 % (ref 0.0–0.2)

## 2018-02-16 LAB — URINALYSIS, ROUTINE W REFLEX MICROSCOPIC
Bilirubin Urine: NEGATIVE
Glucose, UA: NEGATIVE mg/dL
Hgb urine dipstick: NEGATIVE
Ketones, ur: NEGATIVE mg/dL
Leukocytes, UA: NEGATIVE
Nitrite: NEGATIVE
Protein, ur: NEGATIVE mg/dL
Specific Gravity, Urine: 1.016 (ref 1.005–1.030)
pH: 6 (ref 5.0–8.0)

## 2018-02-16 LAB — BASIC METABOLIC PANEL
Anion gap: 6 (ref 5–15)
BUN: 7 mg/dL — ABNORMAL LOW (ref 8–23)
CO2: 27 mmol/L (ref 22–32)
Calcium: 8.9 mg/dL (ref 8.9–10.3)
Chloride: 102 mmol/L (ref 98–111)
Creatinine, Ser: 1.3 mg/dL — ABNORMAL HIGH (ref 0.61–1.24)
GFR calc Af Amer: >60 mL/min (ref >60)
GFR calc non Af Amer: 52 mL/min — ABNORMAL LOW (ref >60)
Glucose, Bld: 105 mg/dL — ABNORMAL HIGH (ref 70–99)
Potassium: 3.8 mmol/L (ref 3.5–5.1)
Sodium: 135 mmol/L (ref 135–145)

## 2018-02-16 LAB — CBG MONITORING, ED: Glucose-Capillary: 147 mg/dL — ABNORMAL HIGH (ref 70–99)

## 2018-02-16 MED ORDER — LIDOCAINE-EPINEPHRINE (PF) 1 %-1:200000 IJ SOLN
10.0000 mL | Freq: Once | INTRAMUSCULAR | Status: DC
  Filled 2018-02-16: qty 30

## 2018-02-16 MED ORDER — ACETAMINOPHEN 325 MG PO TABS
650.0000 mg | ORAL_TABLET | Freq: Once | ORAL | Status: AC
  Administered 2018-02-16: 650 mg via ORAL
  Filled 2018-02-16: qty 2

## 2018-02-16 NOTE — ED Triage Notes (Signed)
Pt c/o syncopal episode that occurred this morning. Pt reports that when he has a cold, he has syncopal episodes. Pt hit head on dresser and has laceration to forehead. AOx4.
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
## 2018-08-13 ENCOUNTER — Emergency Department (HOSPITAL_COMMUNITY): Payer: Medicare Other

## 2018-08-13 ENCOUNTER — Observation Stay (HOSPITAL_COMMUNITY)
Admission: EM | Admit: 2018-08-13 | Discharge: 2018-08-14 | Disposition: A | Discharge status: Home / Self Care | Payer: Medicare Other | Attending: Internal Medicine | Admitting: Internal Medicine

## 2018-08-13 ENCOUNTER — Other Ambulatory Visit: Payer: Self-pay

## 2018-08-13 ENCOUNTER — Encounter (HOSPITAL_COMMUNITY): Payer: Self-pay | Admitting: Emergency Medicine

## 2018-08-13 DIAGNOSIS — R404 Transient alteration of awareness: Secondary | ICD-10-CM | POA: Diagnosis not present

## 2018-08-13 DIAGNOSIS — R55 Syncope and collapse: Secondary | ICD-10-CM

## 2018-08-13 DIAGNOSIS — Z20828 Contact with and (suspected) exposure to other viral communicable diseases: Secondary | ICD-10-CM | POA: Diagnosis not present

## 2018-08-13 DIAGNOSIS — I6782 Cerebral ischemia: Secondary | ICD-10-CM | POA: Diagnosis not present

## 2018-08-13 DIAGNOSIS — I739 Peripheral vascular disease, unspecified: Secondary | ICD-10-CM | POA: Diagnosis present

## 2018-08-13 DIAGNOSIS — R4020 Unspecified coma: Secondary | ICD-10-CM | POA: Diagnosis not present

## 2018-08-13 DIAGNOSIS — R4182 Altered mental status, unspecified: Secondary | ICD-10-CM | POA: Diagnosis present

## 2018-08-13 DIAGNOSIS — K649 Unspecified hemorrhoids: Secondary | ICD-10-CM | POA: Diagnosis present

## 2018-08-13 DIAGNOSIS — Z79899 Other long term (current) drug therapy: Secondary | ICD-10-CM | POA: Diagnosis not present

## 2018-08-13 DIAGNOSIS — R410 Disorientation, unspecified: Principal | ICD-10-CM | POA: Insufficient documentation

## 2018-08-13 DIAGNOSIS — R0689 Other abnormalities of breathing: Secondary | ICD-10-CM | POA: Diagnosis not present

## 2018-08-13 DIAGNOSIS — Z720 Tobacco use: Secondary | ICD-10-CM | POA: Diagnosis present

## 2018-08-13 DIAGNOSIS — I10 Essential (primary) hypertension: Secondary | ICD-10-CM | POA: Diagnosis present

## 2018-08-13 DIAGNOSIS — F17210 Nicotine dependence, cigarettes, uncomplicated: Secondary | ICD-10-CM | POA: Diagnosis not present

## 2018-08-13 LAB — URINALYSIS, ROUTINE W REFLEX MICROSCOPIC
Bilirubin Urine: NEGATIVE
Glucose, UA: NEGATIVE mg/dL
Hgb urine dipstick: NEGATIVE
Ketones, ur: NEGATIVE mg/dL
Leukocytes,Ua: NEGATIVE
Nitrite: NEGATIVE
Protein, ur: NEGATIVE mg/dL
Specific Gravity, Urine: 1.017 (ref 1.005–1.030)
pH: 6 (ref 5.0–8.0)

## 2018-08-13 LAB — RAPID URINE DRUG SCREEN, HOSP PERFORMED
Amphetamines: NOT DETECTED
Barbiturates: NOT DETECTED
Benzodiazepines: NOT DETECTED
Cocaine: NOT DETECTED
Opiates: NOT DETECTED
Tetrahydrocannabinol: NOT DETECTED

## 2018-08-13 LAB — CBC WITH DIFFERENTIAL/PLATELET
Abs Immature Granulocytes: 0.04 10*3/uL (ref 0.00–0.07)
Basophils Absolute: 0 10*3/uL (ref 0.0–0.1)
Basophils Relative: 0 %
Eosinophils Absolute: 0 10*3/uL (ref 0.0–0.5)
Eosinophils Relative: 0 %
HCT: 41.8 % (ref 39.0–52.0)
Hemoglobin: 13.7 g/dL (ref 13.0–17.0)
Immature Granulocytes: 0 %
Lymphocytes Relative: 9 %
Lymphs Abs: 0.8 10*3/uL (ref 0.7–4.0)
MCH: 37 pg — ABNORMAL HIGH (ref 26.0–34.0)
MCHC: 32.8 g/dL (ref 30.0–36.0)
MCV: 113 fL — ABNORMAL HIGH (ref 80.0–100.0)
Monocytes Absolute: 0.4 10*3/uL (ref 0.1–1.0)
Monocytes Relative: 4 %
Neutro Abs: 7.8 10*3/uL — ABNORMAL HIGH (ref 1.7–7.7)
Neutrophils Relative %: 87 %
Platelets: 178 10*3/uL (ref 150–400)
RBC: 3.7 MIL/uL — ABNORMAL LOW (ref 4.22–5.81)
RDW: 12.8 % (ref 11.5–15.5)
WBC: 9 10*3/uL (ref 4.0–10.5)
nRBC: 0 % (ref 0.0–0.2)

## 2018-08-13 LAB — TROPONIN I
Troponin I: <0.03 ng/mL (ref <0.03)
Troponin I: <0.03 ng/mL (ref <0.03)

## 2018-08-13 LAB — APTT: aPTT: 32 seconds (ref 24–36)

## 2018-08-13 LAB — PROTIME-INR
INR: 1.2 (ref 0.8–1.2)
Prothrombin Time: 14.6 seconds (ref 11.4–15.2)

## 2018-08-13 LAB — ETHANOL: Alcohol, Ethyl (B): <10 mg/dL (ref <10)

## 2018-08-13 LAB — COMPREHENSIVE METABOLIC PANEL
ALT: 10 U/L (ref 0–44)
AST: 16 U/L (ref 15–41)
Albumin: 3.9 g/dL (ref 3.5–5.0)
Alkaline Phosphatase: 32 U/L — ABNORMAL LOW (ref 38–126)
Anion gap: 10 (ref 5–15)
BUN: 9 mg/dL (ref 8–23)
CO2: 24 mmol/L (ref 22–32)
Calcium: 8.5 mg/dL — ABNORMAL LOW (ref 8.9–10.3)
Chloride: 104 mmol/L (ref 98–111)
Creatinine, Ser: 1.44 mg/dL — ABNORMAL HIGH (ref 0.61–1.24)
GFR calc Af Amer: 55 mL/min — ABNORMAL LOW (ref >60)
GFR calc non Af Amer: 47 mL/min — ABNORMAL LOW (ref >60)
Glucose, Bld: 161 mg/dL — ABNORMAL HIGH (ref 70–99)
Potassium: 3.5 mmol/L (ref 3.5–5.1)
Sodium: 138 mmol/L (ref 135–145)
Total Bilirubin: 0.7 mg/dL (ref 0.3–1.2)
Total Protein: 6.7 g/dL (ref 6.5–8.1)

## 2018-08-13 LAB — CBG MONITORING, ED: Glucose-Capillary: 140 mg/dL — ABNORMAL HIGH (ref 70–99)

## 2018-08-13 LAB — SARS CORONAVIRUS 2 BY RT PCR (HOSPITAL ORDER, PERFORMED IN ~~LOC~~ HOSPITAL LAB): SARS Coronavirus 2: NEGATIVE

## 2018-08-13 MED ORDER — SODIUM CHLORIDE 0.9 % IV SOLN
INTRAVENOUS | Status: DC
  Administered 2018-08-13: 23:00:00 via INTRAVENOUS

## 2018-08-13 MED ORDER — NICOTINE 21 MG/24HR TD PT24
21.0000 mg | MEDICATED_PATCH | Freq: Once | TRANSDERMAL | Status: DC
  Administered 2018-08-13: 23:00:00 21 mg via TRANSDERMAL
  Filled 2018-08-13: qty 1

## 2018-08-13 MED ORDER — ACETAMINOPHEN 650 MG RE SUPP: 650.0000 mg | Freq: Four times a day (QID) | RECTAL | Status: DC | PRN

## 2018-08-13 MED ORDER — VITAMIN D 25 MCG (1000 UNIT) PO TABS
1000.0000 [IU] | ORAL_TABLET | Freq: Every day | ORAL | Status: DC
  Administered 2018-08-14: 11:00:00 1000 [IU] via ORAL
  Filled 2018-08-13: qty 1

## 2018-08-13 MED ORDER — TRAZODONE HCL 50 MG PO TABS
50.0000 mg | ORAL_TABLET | Freq: Every evening | ORAL | Status: DC | PRN
  Filled 2018-08-13: qty 1

## 2018-08-13 MED ORDER — ACETAMINOPHEN 325 MG PO TABS: 650.0000 mg | ORAL_TABLET | Freq: Four times a day (QID) | ORAL | Status: DC | PRN

## 2018-08-13 MED ORDER — SODIUM CHLORIDE 0.9% FLUSH
3.0000 mL | Freq: Two times a day (BID) | INTRAVENOUS | Status: DC
  Administered 2018-08-13 – 2018-08-14 (×2): 3 mL via INTRAVENOUS

## 2018-08-13 MED ORDER — POLYETHYLENE GLYCOL 3350 17 G PO PACK: 17.0000 g | PACK | Freq: Every day | ORAL | Status: DC | PRN

## 2018-08-13 MED ORDER — LORAZEPAM 2 MG/ML IJ SOLN
0.5000 mg | Freq: Four times a day (QID) | INTRAMUSCULAR | Status: DC | PRN
  Administered 2018-08-14: 01:00:00 0.5 mg via INTRAVENOUS
  Filled 2018-08-13: qty 1

## 2018-08-13 MED ORDER — ONDANSETRON HCL 4 MG/2ML IJ SOLN: 4.0000 mg | Freq: Four times a day (QID) | INTRAMUSCULAR | Status: DC | PRN

## 2018-08-13 MED ORDER — LEVOTHYROXINE SODIUM 100 MCG PO TABS
100.0000 ug | ORAL_TABLET | Freq: Every day | ORAL | Status: DC
  Administered 2018-08-14: 06:00:00 100 ug via ORAL
  Filled 2018-08-13: qty 1

## 2018-08-13 MED ORDER — HEPARIN SODIUM (PORCINE) 5000 UNIT/ML IJ SOLN
5000.0000 [IU] | Freq: Three times a day (TID) | INTRAMUSCULAR | Status: DC
  Administered 2018-08-13 – 2018-08-14 (×2): 5000 [IU] via SUBCUTANEOUS
  Filled 2018-08-13 (×2): qty 1

## 2018-08-13 MED ORDER — HALOPERIDOL LACTATE 5 MG/ML IJ SOLN: 5.0000 mg | Freq: Four times a day (QID) | INTRAMUSCULAR | Status: DC | PRN

## 2018-08-13 MED ORDER — SODIUM CHLORIDE 0.9% FLUSH: 3.0000 mL | INTRAVENOUS | Status: DC | PRN

## 2018-08-13 MED ORDER — ALTEPLASE 100 MG IV SOLR
INTRAVENOUS | Status: AC
  Filled 2018-08-13: qty 100

## 2018-08-13 MED ORDER — ALBUTEROL SULFATE (2.5 MG/3ML) 0.083% IN NEBU: 2.5000 mg | INHALATION_SOLUTION | RESPIRATORY_TRACT | Status: DC | PRN

## 2018-08-13 MED ORDER — SODIUM CHLORIDE 0.9 % IV SOLN: 250.0000 mL | INTRAVENOUS | Status: DC | PRN

## 2018-08-13 MED ORDER — LORAZEPAM 2 MG/ML IJ SOLN
1.0000 mg | Freq: Once | INTRAMUSCULAR | Status: AC
  Administered 2018-08-13: 17:00:00 1 mg via INTRAVENOUS
  Filled 2018-08-13: qty 1

## 2018-08-13 MED ORDER — ONDANSETRON HCL 4 MG PO TABS: 4.0000 mg | ORAL_TABLET | Freq: Four times a day (QID) | ORAL | Status: DC | PRN

## 2018-08-13 NOTE — Consult Note (Signed)
[facility] Tele[facility]urology Consult Services   Date of Service:   08/13/2018 16:56:26  Impression:     .  Rule Out Acute Ischemic Stroke     .  Syncope  Comments/Sign-Out: 78 y/o man presents with a syncopal episode after a bowel movement. CT head reviewed. Patient to be admitted for further eval . No focal neurologic deficits on exam. Recent GI bleeding as per wife.  Metrics: Last Known Well: 08/13/2018 15:15:00 [facility] Notification Time: 08/13/2018 16:56:26 Arrival Time: 08/13/2018 16:09:00 Stamp Time: 08/13/2018 16:56:26 Time First Login Attempt: 08/13/2018 16:59:59 Video Start Time: 08/13/2018 16:59:59  Symptoms: syncope NIHSS Start Assessment Time: 08/13/2018 17:00:20 Patient is not a candidate for tPA. Patient was not deemed candidate for tPA thrombolytics because of No focal neurologic deficit. Recent blood bowel movement. Video End Time: 08/13/2018 17:16:21  CT head was reviewed.  Clinical Presentation is not Suggestive of Large Vessel Occlusive Disease  ED Physician notified of diagnostic impression and management plan on 08/13/2018 17:14:02  Our recommendations are outlined below.  Recommendations:     .  Activate Stroke Protocol Admission/Order Set     .  Stroke/Telemetry Floor     .  Neuro Checks     .  Bedside Swallow Eval     .  DVT Prophylaxis     .  IV Fluids, Normal Saline     .  Head of Bed 30 Degrees     .  Euglycemia and Avoid Hyperthermia (PRN Acetaminophen)     .  ASA if no contraindication - recommend GI work-up first   Sign Out:     .  Discussed with Emergency Department Provider    ------------------------------------------------------------------------------  History of Present Illness: Patient is a 78 year old Male.  78 y/o man with h/o HTN presents with a syncopal episode after using the restroom. Wife reported the patient was cold and clammy and unresponsive. Patient complained of abdominal pain before  having bowel movement. Last reported well at 1515. Patient reportedly hypotensive on the scene and received IV fluid bolus. Emergent telestroke consult requested for AMS. Patient received Ativan for CT because he was combative. He appears drowsy. Moves all extremities spontaneously and withdraws in all extremities. Wife called over the phone. Wife states this has happened multiple times. Patient's wife states the patient had some bloody stools within the week. Case discussed with ED attending at bedside   Examination: 1A: Level of Consciousness - Arouses to minor stimulation + 1 1B: Ask Month and Age - Could Not Answer Either Question Correctly + 2 1C: Blink Eyes & Squeeze Hands - Performs 0 Tasks + 2 2: Test Horizontal Extraocular Movements - Normal + 0 3: Test Visual Fields - No Visual Loss + 0 4: Test Facial Palsy (Use Grimace if Obtunded) - Normal symmetry + 0 5A: Test Left Arm Motor Drift - No Drift for 10 Seconds + 0 5B: Test Right Arm Motor Drift - No Drift for 10 Seconds + 0 6A: Test Left Leg Motor Drift - No Effort Against Gravity + 3 6B: Test Right Leg Motor Drift - No Effort Against Gravity + 3 7: Test Limb Ataxia (FNF/Heel-Shin) - No Ataxia + 0 8: Test Sensation - Normal; No sensory loss + 0 9: Test Language/Aphasia - Normal; No aphasia + 0 10: Test Dysarthria - Normal + 0 11: Test Extinction/Inattention - No abnormality + 0  NIHSS Score: 11   Due to the immediate potential for life-threatening deterioration due to underlying acute neurologic  illness, I spent 20 minutes providing critical care. This time includes time for face to face visit via telemedicine, review of medical records, imaging studies and discussion of findings with providers, the patient and/or family.   Dr Meryl Crutch   [facility] [phone]   Case [id number]

## 2018-08-13 NOTE — TOC Initial Note (Signed)
Transition of Care [facility]) - Initial/Assessment Note    Patient Details  Name: [name] MRN: [id number] Date of Birth: Sep 01, 1940  Transition of Care [facility]) CM/SW Contact:    Genessa Beman Dimitri Ped, LCSW Phone Number: 08/13/2018, 10:47 PM  Clinical Narrative:    Pt is a 78 year old Caucasian male who was admitted into the ED on 08/13/2018 with the following Dx: Syncope and Collapse. Pt reported that prior to presenting to the ED, he resided in a single story home with his wife. Pt explains that if sick, pt rely's on his wife for support. Pt reports that he is able to toilet, bathe, and ambulate independently without the use of any DME or assistive devices.   Pt shares that his PCP is Gerarda Fraction, MD located in [place], [place]. Per pt, Pt has no hx of home health services.   Pt refused to sign Observation notice and is appears to be apprehensive about answering questions and is focused on discharging home. When asked what led up to him visiting the ED, pt became silent. Pt appeared to be shocked after given observation notice and stated that he was not expected his stay to last 2 days.   TOC team will continue to follow pt if any discharge needs arise.  Ashley LCSWA Transitions of Care  Clinical Social Worker  Ph: [phone]          Expected Discharge Plan: Home/Self Care Barriers to Discharge: No Barriers Identified   Patient Goals and CMS Choice Patient states their goals for this hospitalization and ongoing recovery are:: to return home with wife      Expected Discharge Plan and Services Expected Discharge Plan: Home/Self Care       Living arrangements for the past 2 months: Single Family Home                                      Prior Living Arrangements/Services Living arrangements for the past 2 months: Single Family Home Lives with:: Spouse Patient language and need for interpreter reviewed:: Yes Do you feel safe going back to the place where you  live?: Yes      Need for Family Participation in Patient Care: No (Comment) Care giver support system in place?: Yes (comment)   Criminal Activity/Legal Involvement Pertinent to Current Situation/Hospitalization: No - Comment as needed  Activities of Daily Living      Permission Sought/Granted Permission sought to share information with : Family Supports, Case Manager Permission granted to share information with : Yes, Verbal Permission Granted  Share Information with NAME: Marquez Ceesay ph: [phone]           Emotional Assessment Appearance:: Appears stated age Attitude/Demeanor/Rapport: Apprehensive, Engaged Affect (typically observed): Guarded, Calm Orientation: : Oriented to Self, Oriented to  Time, Oriented to Situation, Oriented to Place   Psych Involvement: No (comment)  Admission diagnosis:  Unresponsive Patient Active Problem List   Diagnosis Date Noted  . Syncope and collapse 08/13/2018  . Tobacco abuse 08/13/2018  . Internal Hemorrhoids with Occasional Rectal Bleeding 08/13/2018  . Peripheral vascular disease, unspecified ([place]) 12/15/2013  . LGI bleed 08/07/2012  . HTN (hypertension)   . Status post arthroscopy of knee 11/05/2010  . Medial meniscus, posterior horn derangement 11/05/2010  . OA (osteoarthritis) of knee 11/05/2010   PCP:  Redmond School, MD Pharmacy:   [facility] [address], [place] - [phone] [place] [address] Phone: [phone] Fax: [phone]  [id number] [place], [address] [place] [address] Phone: [phone] Fax: [phone]     Social Determinants of Health (SDOH) Interventions    Readmission Risk Interventions No flowsheet data found.
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
## 2018-08-13 NOTE — ED Notes (Signed)
Pt returned from CT. Stroke Nurse on telecart. Report has already been given to stroke nurse.

## 2018-08-13 NOTE — ED Notes (Signed)
EDP at bedside  

## 2018-08-13 NOTE — ED Triage Notes (Signed)
Per EMS, pt from home. Pt was sitting on the toilet found unresponsive. Wife does not know how long patient was in bathroom. When EMS arrived, pt pale, diaphoretic, no radial pulse could be palpated, and BP was unable to be obtained. CBG was 148. Pt placed on NRB. Once patient was in truck, pt began moving legs. Approx 5 min ago, pt became more responsive. Pt combative at this time.
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
## 2018-08-13 NOTE — ED Notes (Signed)
pt'wife [phone]

## 2018-08-13 NOTE — Care Management Obs Status (Signed)
[place] NOTIFICATION   Patient Details  Name: RODELL MARRS MRN: [id number] Date of Birth: 24-Oct-1940   Medicare Observation Status Notification Given:  No  Pt refused to sign observation notice     [place], LCSW 08/13/2018, 10:30 PM

## 2018-08-13 NOTE — Progress Notes (Signed)
CODE STROKE 1637 call time 1638 beeper 1648 exam started 1650 exam finished, images sent to [facility],  [place] exam  Completed in epic, called [place] rad, spoke with Valarie Merino.
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
## 2018-08-14 ENCOUNTER — Observation Stay (HOSPITAL_COMMUNITY): Payer: Medicare Other

## 2018-08-14 ENCOUNTER — Observation Stay (HOSPITAL_BASED_OUTPATIENT_CLINIC_OR_DEPARTMENT_OTHER): Payer: Medicare Other

## 2018-08-14 DIAGNOSIS — R55 Syncope and collapse: Secondary | ICD-10-CM | POA: Diagnosis not present

## 2018-08-14 DIAGNOSIS — I361 Nonrheumatic tricuspid (valve) insufficiency: Secondary | ICD-10-CM | POA: Diagnosis not present

## 2018-08-14 DIAGNOSIS — I6523 Occlusion and stenosis of bilateral carotid arteries: Secondary | ICD-10-CM | POA: Diagnosis not present

## 2018-08-14 DIAGNOSIS — I371 Nonrheumatic pulmonary valve insufficiency: Secondary | ICD-10-CM | POA: Diagnosis not present

## 2018-08-14 LAB — CBC
HCT: 38.6 % — ABNORMAL LOW (ref 39.0–52.0)
Hemoglobin: 13.1 g/dL (ref 13.0–17.0)
MCH: 37.2 pg — ABNORMAL HIGH (ref 26.0–34.0)
MCHC: 33.9 g/dL (ref 30.0–36.0)
MCV: 109.7 fL — ABNORMAL HIGH (ref 80.0–100.0)
Platelets: 188 10*3/uL (ref 150–400)
RBC: 3.52 MIL/uL — ABNORMAL LOW (ref 4.22–5.81)
RDW: 12.8 % (ref 11.5–15.5)
WBC: 8.9 10*3/uL (ref 4.0–10.5)
nRBC: 0 % (ref 0.0–0.2)

## 2018-08-14 LAB — BASIC METABOLIC PANEL
Anion gap: 7 (ref 5–15)
BUN: 8 mg/dL (ref 8–23)
CO2: 26 mmol/L (ref 22–32)
Calcium: 8.8 mg/dL — ABNORMAL LOW (ref 8.9–10.3)
Chloride: 105 mmol/L (ref 98–111)
Creatinine, Ser: 1.07 mg/dL (ref 0.61–1.24)
GFR calc Af Amer: >60 mL/min (ref >60)
GFR calc non Af Amer: >60 mL/min (ref >60)
Glucose, Bld: 88 mg/dL (ref 70–99)
Potassium: 3.8 mmol/L (ref 3.5–5.1)
Sodium: 138 mmol/L (ref 135–145)

## 2018-08-14 LAB — ECHOCARDIOGRAM COMPLETE
Height: 71 in
Weight: 3439.18 oz

## 2018-08-14 MED ORDER — ENSURE ENLIVE PO LIQD: 237.0000 mL | Freq: Two times a day (BID) | ORAL | 12 refills | Status: DC

## 2018-08-14 MED ORDER — SODIUM CHLORIDE 0.9 % IV BOLUS
1000.0000 mL | Freq: Once | INTRAVENOUS | Status: AC
  Administered 2018-08-14: 08:00:00 1000 mL via INTRAVENOUS

## 2018-08-14 MED ORDER — ENSURE ENLIVE PO LIQD
237.0000 mL | Freq: Two times a day (BID) | ORAL | Status: DC
  Administered 2018-08-14: 11:00:00 237 mL via ORAL

## 2018-08-14 NOTE — Care Management (Signed)
F/u appt arranged, added to AVS.

## 2018-08-14 NOTE — Progress Notes (Signed)
*  PRELIMINARY RESULTS* Echocardiogram 2D Echocardiogram has been performed.  [name] 08/14/2018, 9:24 AM

## 2018-08-14 NOTE — Discharge Summary (Signed)
Physician Discharge Summary  RENDON HOWELL GYB:[id number] DOB: 03-03-1941 DOA: 08/13/2018  PCP: Redmond School, MD  Admit date: 08/13/2018  Discharge date: 08/14/2018  Admitted From:Home  Disposition:  Home  Recommendations for Outpatient Follow-up:  1. Follow up with PCP in 1-2 weeks 2. Continue to monitor weights carefully and remain on nutritional supplements as prescribed  Home Health: None  Equipment/Devices: None  Discharge Condition: Stable  CODE STATUS: Full  Diet recommendation: Heart Healthy  Brief/Interim Summary: Per HPI: [name]  is a 78 y.o. male with past medical history relevant for PVD/PAD, tobacco abuse, HTN presented to the ED by EMS with altered mentation after being found passed out in the bathroom by his wife at home.. Patient was states that he was having a meal, developed abdominal pain.... Went into the bathroom to have BM.... When wife did not hear from him for a while she went into the bathroom to check on him.... Finding passed out.... He apparently had a nonbloody BM and had urinated... He was confused, disoriented and combative----so she called EMS  Initially patient was combative, became lethargic after Ativan was administered... He was moving all extremities, and there was no concerning for seizure type activity... As per patient's wife he has had episodes of passing out/syncope in the past, but is usually not associated with significant change in behavior and mentation   Patient with history of internal hemorrhoids ---as per patient's wife there was one episode of rectal bleeding over a week ago, no rectal bleeding since then, specifically patient's bowel movement PTA on 08/13/2018 was NOT bloody according to patient's wife .... Hemoglobin is stable at 13.7  No chest pains, no palpitations, no fevers no chills no productive cough,, no leg pains or pleuritic symptoms.... No vomiting or diarrhea, no obvious injuries  Alcohol level  negative, glucose was 161, troponin negative, CT head and EKG without acute findings  Patient was admitted with syncope and collapse likely micturition syncope.  He has had poor oral intake in the recent past due to overall lack of appetite and has lost approximately 12 pounds of weight as well.  This was a likely contributing factor as he was noted to be orthostatic on admission and his creatinine was noted to be elevated to 1.44 from baseline 1.3.  He states he has been losing some muscle mass and has felt somewhat weak as well and this is likely contributing factor.  At this point in time, I have recommended that patient remain on nutrition shakes to help maintain his weight and follow progress carefully and monitor with his PCP.  His orthostasis has improved otherwise and he has no other abnormalities noted on 2D echocardiogram which demonstrates LVEF of 50 to 55% no other wall motion abnormalities or significant valvular disorders.  Carotid ultrasound with right carotid stenosis of 50 to 69% and left-sided noted to be minimal.  Patient is currently stable for discharge and has ambulated without any significant symptomatology.  Discharge Diagnoses:  Principal Problem:   Syncope and collapse Active Problems:   HTN (hypertension)   Peripheral vascular disease, unspecified (HCC)   Tobacco abuse   Internal Hemorrhoids with Occasional Rectal Bleeding  Principal discharge diagnosis: Syncope and collapse secondary to orthostasis and micturition.  Discharge Instructions  Discharge Instructions    Diet - low sodium heart healthy   Complete by:  As directed    Increase activity slowly   Complete by:  As directed      Allergies as of 08/14/2018  Reactions   Simvastatin    Reaction is unknown       Medication List    TAKE these medications   cholecalciferol 25 MCG (1000 UT) tablet Commonly known as:  VITAMIN D3 Take 1,000 Units by mouth daily.   doxazosin 4 MG tablet Commonly known  as:  CARDURA Take 4 mg by mouth at bedtime.   feeding supplement (ENSURE ENLIVE) Liqd Take 237 mLs by mouth 2 (two) times daily between meals.   levothyroxine 100 MCG tablet Commonly known as:  SYNTHROID Take 100 mcg by mouth daily.      Follow-up Information    Redmond School, MD Follow up in 1 week(s).   Specialty:  Internal Medicine Contact information: [address] [place] [address] [phone]          Allergies  Allergen Reactions  . Simvastatin     Reaction is unknown     Consultations:  None   Procedures/Studies: US Carotid Bilateral  Result Date: 08/14/2018 CLINICAL DATA:  78 year old male with a history of syncope EXAM: BILATERAL CAROTID DUPLEX ULTRASOUND TECHNIQUE: Pearline Cables scale imaging, color Doppler and duplex ultrasound were performed of bilateral carotid and vertebral arteries in the neck. COMPARISON:  None. FINDINGS: Criteria: Quantification of carotid stenosis is based on velocity parameters that correlate the residual internal carotid diameter with NASCET-based stenosis levels, using the diameter of the distal internal carotid lumen as the denominator for stenosis measurement. The following velocity measurements were obtained: RIGHT ICA:  Systolic 025 cm/sec, Diastolic 41 cm/sec CCA:  95 cm/sec SYSTOLIC ICA/CCA RATIO:  1.5 ECA:  126 cm/sec LEFT ICA:  Systolic 97 cm/sec, Diastolic 25 cm/sec CCA:  852 cm/sec SYSTOLIC ICA/CCA RATIO:  0.9 ECA:  145 cm/sec Right Brachial SBP: Not acquired Left Brachial SBP: Not acquired RIGHT CAROTID ARTERY: No significant calcifications of the right common carotid artery. Intermediate waveform maintained. Heterogeneous and partially calcified plaque at the right carotid bifurcation. No significant lumen shadowing. Low resistance waveform of the right ICA. No significant tortuosity. RIGHT VERTEBRAL ARTERY: Antegrade flow with low resistance waveform. LEFT CAROTID ARTERY: No significant calcifications of the left common  carotid artery. Intermediate waveform maintained. Heterogeneous and partially calcified plaque at the left carotid bifurcation without significant lumen shadowing. Low resistance waveform of the left ICA. No significant tortuosity. LEFT VERTEBRAL ARTERY:  Antegrade flow with low resistance waveform. IMPRESSION: Right: Heterogeneous plaque at the carotid bifurcation, with discordant results regarding degree of stenosis by established duplex criteria. Peak velocity suggests 50%-69% stenosis, with the ICA/ CCA ratio suggesting a lesser degree of stenosis. If establishing a more accurate degree of stenosis is required, cerebral angiogram should be considered, or as a second best test, CTA. Left: Color duplex indicates minimal left-sided heterogeneous and calcified plaque, with no hemodynamically significant stenosis by duplex criteria in the extracranial cerebrovascular circulation. Signed, Dulcy Fanny. Dellia Nims, RPVI [facility] Electronically Signed   By: Corrie Mckusick D.O.   On: 08/14/2018 10:57   Ct Head Code Stroke Wo Contrast  Result Date: 08/13/2018 CLINICAL DATA:  Code stroke.  Altered consciousness. EXAM: CT HEAD WITHOUT CONTRAST TECHNIQUE: Contiguous axial images were obtained from the base of the skull through the vertex without intravenous contrast. COMPARISON:  CT head 02/16/2018 FINDINGS: Brain: Generalized atrophy. Extensive chronic white matter changes. Negative for acute infarct. Negative for hemorrhage or mass. Vascular: Negative for hyperdense vessel Skull: Negative Sinuses/Orbits: Mild mucosal edema paranasal sinuses. Prior sinus surgery on the left. Negative orbit. Other: None  ASPECTS [facility] Stroke Program Early CT Score) - Ganglionic level infarction (caudate, lentiform nuclei, internal capsule, insula, M1-M3 cortex): 7 - Supraganglionic infarction (M4-M6 cortex): 3 Total score (0-10 with 10 being normal): 10 IMPRESSION: 1. Extensive  chronic microvascular ischemic change in the white matter. No acute cortical infarct 2. ASPECTS is 10 3. These results were called by telephone at the time of interpretation on 08/13/2018 at 5:03 pm to Dr. Lacinda Axon , who verbally acknowledged these results. Electronically Signed   By: Franchot Gallo M.D.   On: 08/13/2018 17:04     Discharge Exam: Vitals:   08/14/18 0519 08/14/18 1017  BP: (!) 141/71   Pulse: 75   Resp: 20   Temp:    SpO2: 97% 98%   Vitals:   08/14/18 0101 08/14/18 0318 08/14/18 0519 08/14/18 1017  BP: 127/69 (!) 143/72 (!) 141/71   Pulse: 78 72 75   Resp: 20 20 20    Temp:      TempSrc:      SpO2: 95% 96% 97% 98%  Weight:      Height:        General: Pt is alert, awake, not in acute distress Cardiovascular: RRR, S1/S2 +, no rubs, no gallops Respiratory: CTA bilaterally, no wheezing, no rhonchi Abdominal: Soft, NT, ND, bowel sounds + Extremities: no edema, no cyanosis    The results of significant diagnostics from this hospitalization (including imaging, microbiology, ancillary and laboratory) are listed below for reference.     Microbiology: Recent Results (from the past 240 hour(s))  SARS Coronavirus 2 (CEPHEID - Performed in [place] hospital lab), Hosp Order     Status: None   Collection Time: 08/13/18  8:36 PM  Result Value Ref Range Status   SARS Coronavirus 2 NEGATIVE NEGATIVE Final    Comment: (NOTE) If result is NEGATIVE SARS-CoV-2 target nucleic acids are NOT DETECTED. The SARS-CoV-2 RNA is generally detectable in upper and lower  respiratory specimens during the acute phase of infection. The lowest  concentration of SARS-CoV-2 viral copies this assay can detect is 250  copies / mL. A negative result does not preclude SARS-CoV-2 infection  and should not be used as the sole basis for treatment or other  patient management decisions.  A negative result may occur with  improper specimen collection / handling, submission of specimen other  than  nasopharyngeal swab, presence of viral mutation(s) within the  areas targeted by this assay, and inadequate number of viral copies  (<250 copies / mL). A negative result must be combined with clinical  observations, patient history, and epidemiological information. If result is POSITIVE SARS-CoV-2 target nucleic acids are DETECTED. The SARS-CoV-2 RNA is generally detectable in upper and lower  respiratory specimens dur ing the acute phase of infection.  Positive  results are indicative of active infection with SARS-CoV-2.  Clinical  correlation with patient history and other diagnostic information is  necessary to determine patient infection status.  Positive results do  not rule out bacterial infection or co-infection with other viruses. If result is PRESUMPTIVE POSTIVE SARS-CoV-2 nucleic acids MAY BE PRESENT.   A presumptive positive result was obtained on the submitted specimen  and confirmed on repeat testing.  While 2019 novel coronavirus  (SARS-CoV-2) nucleic acids may be present in the submitted sample  additional confirmatory testing may be necessary for epidemiological  and / or clinical management purposes  to differentiate between  SARS-CoV-2 and other Sarbecovirus currently known to infect humans.  If clinically indicated additional  testing with an alternate test  methodology [phone]) is advised. The SARS-CoV-2 RNA is generally  detectable in upper and lower respiratory sp ecimens during the acute  phase of infection. The expected result is Negative. Fact Sheet for Patients:  StrictlyIdeas.no Fact Sheet for Healthcare Providers: BankingDealers.co.za This test is not yet approved or cleared by the [place] FDA and has been authorized for detection and/or diagnosis of SARS-CoV-2 by FDA under an Emergency Use Authorization (EUA).  This EUA will remain in effect (meaning this test can be used) for the duration of  the COVID-19 declaration under Section 564(b)(1) of the Act, 21 U.S.C. section 360bbb-3(b)(1), unless the authorization is terminated or revoked sooner. Performed at [facility], [address] [place], [place] [address]      Labs: BNP (last 3 results) No results for input(s): BNP in the last 8760 hours. Basic Metabolic Panel: Recent Labs  Lab 08/13/18 1648 08/14/18 0631  NA 138 138  K 3.5 3.8  CL 104 105  CO2 24 26  GLUCOSE 161* 88  BUN 9 8  CREATININE 1.44* 1.07  CALCIUM 8.5* 8.8*   Liver Function Tests: Recent Labs  Lab 08/13/18 1648  AST 16  ALT 10  ALKPHOS 32*  BILITOT 0.7  PROT 6.7  ALBUMIN 3.9   No results for input(s): LIPASE, AMYLASE in the last 168 hours. No results for input(s): AMMONIA in the last 168 hours. CBC: Recent Labs  Lab 08/13/18 1648 08/14/18 0631  WBC 9.0 8.9  NEUTROABS 7.8*  --   HGB 13.7 13.1  HCT 41.8 38.6*  MCV 113.0* 109.7*  PLT 178 188   Cardiac Enzymes: Recent Labs  Lab 08/13/18 1648 08/13/18 2144  TROPONINI <0.03 <0.03   BNP: Invalid input(s): POCBNP CBG: Recent Labs  Lab 08/13/18 1702  GLUCAP 140*   D-Dimer No results for input(s): DDIMER in the last 72 hours. Hgb A1c No results for input(s): HGBA1C in the last 72 hours. Lipid Profile No results for input(s): CHOL, HDL, LDLCALC, TRIG, CHOLHDL, LDLDIRECT in the last 72 hours. Thyroid function studies No results for input(s): TSH, T4TOTAL, T3FREE, THYROIDAB in the last 72 hours.  Invalid input(s): FREET3 Anemia work up No results for input(s): VITAMINB12, FOLATE, FERRITIN, TIBC, IRON, RETICCTPCT in the last 72 hours. Urinalysis    Component Value Date/Time   COLORURINE YELLOW 08/13/2018 1831   APPEARANCEUR HAZY (A) 08/13/2018 1831   LABSPEC 1.017 08/13/2018 1831   PHURINE 6.0 08/13/2018 1831   GLUCOSEU NEGATIVE 08/13/2018 1831   HGBUR NEGATIVE 08/13/2018 1831   BILIRUBINUR NEGATIVE 08/13/2018 1831   KETONESUR NEGATIVE 08/13/2018 1831   PROTEINUR  NEGATIVE 08/13/2018 1831   NITRITE NEGATIVE 08/13/2018 1831   LEUKOCYTESUR NEGATIVE 08/13/2018 1831   Sepsis Labs Invalid input(s): PROCALCITONIN,  WBC,  LACTICIDVEN Microbiology Recent Results (from the past 240 hour(s))  SARS Coronavirus 2 (CEPHEID - Performed in [place] hospital lab), Hosp Order     Status: None   Collection Time: 08/13/18  8:36 PM  Result Value Ref Range Status   SARS Coronavirus 2 NEGATIVE NEGATIVE Final    Comment: (NOTE) If result is NEGATIVE SARS-CoV-2 target nucleic acids are NOT DETECTED. The SARS-CoV-2 RNA is generally detectable in upper and lower  respiratory specimens during the acute phase of infection. The lowest  concentration of SARS-CoV-2 viral copies this assay can detect is 250  copies / mL. A negative result does not preclude SARS-CoV-2 infection  and should not be used as the sole basis for treatment  or other  patient management decisions.  A negative result may occur with  improper specimen collection / handling, submission of specimen other  than nasopharyngeal swab, presence of viral mutation(s) within the  areas targeted by this assay, and inadequate number of viral copies  (<250 copies / mL). A negative result must be combined with clinical  observations, patient history, and epidemiological information. If result is POSITIVE SARS-CoV-2 target nucleic acids are DETECTED. The SARS-CoV-2 RNA is generally detectable in upper and lower  respiratory specimens dur ing the acute phase of infection.  Positive  results are indicative of active infection with SARS-CoV-2.  Clinical  correlation with patient history and other diagnostic information is  necessary to determine patient infection status.  Positive results do  not rule out bacterial infection or co-infection with other viruses. If result is PRESUMPTIVE POSTIVE SARS-CoV-2 nucleic acids MAY BE PRESENT.   A presumptive positive result was obtained on the submitted specimen  and  confirmed on repeat testing.  While 2019 novel coronavirus  (SARS-CoV-2) nucleic acids may be present in the submitted sample  additional confirmatory testing may be necessary for epidemiological  and / or clinical management purposes  to differentiate between  SARS-CoV-2 and other Sarbecovirus currently known to infect humans.  If clinically indicated additional testing with an alternate test  methodology [phone]) is advised. The SARS-CoV-2 RNA is generally  detectable in upper and lower respiratory sp ecimens during the acute  phase of infection. The expected result is Negative. Fact Sheet for Patients:  StrictlyIdeas.no Fact Sheet for Healthcare Providers: BankingDealers.co.za This test is not yet approved or cleared by the [place] FDA and has been authorized for detection and/or diagnosis of SARS-CoV-2 by FDA under an Emergency Use Authorization (EUA).  This EUA will remain in effect (meaning this test can be used) for the duration of the COVID-19 declaration under Section 564(b)(1) of the Act, 21 U.S.C. section 360bbb-3(b)(1), unless the authorization is terminated or revoked sooner. Performed at [facility], [address], [place], [place] [address]      Time coordinating discharge: 35 minutes  SIGNED:   Rodena Goldmann, DO [facility] 08/14/2018, 12:41 PM  If 7PM-7AM, please contact night-coverage [email or web address] Password [email or web address]

## 2018-08-19 DIAGNOSIS — Z1389 Encounter for screening for other disorder: Secondary | ICD-10-CM | POA: Diagnosis not present

## 2018-08-19 DIAGNOSIS — E7849 Other hyperlipidemia: Secondary | ICD-10-CM | POA: Diagnosis not present

## 2018-08-19 DIAGNOSIS — E782 Mixed hyperlipidemia: Secondary | ICD-10-CM | POA: Diagnosis not present

## 2018-08-19 DIAGNOSIS — R55 Syncope and collapse: Secondary | ICD-10-CM | POA: Diagnosis not present

## 2018-08-19 DIAGNOSIS — Z0001 Encounter for general adult medical examination with abnormal findings: Secondary | ICD-10-CM | POA: Diagnosis not present

## 2018-08-19 DIAGNOSIS — Z0000 Encounter for general adult medical examination without abnormal findings: Secondary | ICD-10-CM | POA: Diagnosis not present

## 2018-09-22 ENCOUNTER — Encounter: Payer: Self-pay | Admitting: Cardiology

## 2018-09-22 ENCOUNTER — Ambulatory Visit (INDEPENDENT_AMBULATORY_CARE_PROVIDER_SITE_OTHER): Payer: Medicare Other | Admitting: Cardiology

## 2018-09-22 VITALS — BP 139/79 | HR 89 | Temp 98.8°F | Ht 71.0 in | Wt 206.0 lb

## 2018-09-22 DIAGNOSIS — I6521 Occlusion and stenosis of right carotid artery: Secondary | ICD-10-CM | POA: Diagnosis not present

## 2018-09-22 DIAGNOSIS — E782 Mixed hyperlipidemia: Secondary | ICD-10-CM

## 2018-09-22 DIAGNOSIS — I739 Peripheral vascular disease, unspecified: Secondary | ICD-10-CM

## 2018-09-22 DIAGNOSIS — Z87898 Personal history of other specified conditions: Secondary | ICD-10-CM
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
## 2018-09-22 NOTE — Patient Instructions (Addendum)
Medication Instructions:  Stop Cardura   Take 81 mg aspirin daily   Labwork: none  Testing/Procedures: Your physician has requested that you have a carotid duplex. This test is an ultrasound of the carotid arteries in your neck. It looks at blood flow through these arteries that supply the brain with blood. Allow one hour for this exam. There are no restrictions or special instructions. (DUE MAY 2021)    Follow-Up: Your physician recommends that you schedule a follow-up appointment in:  3 months    Any Other Special Instructions Will Be Listed Below (If Applicable).     If you need a refill on your cardiac medications before your next appointment, please call your pharmacy.

## 2018-12-15 ENCOUNTER — Other Ambulatory Visit: Payer: Self-pay | Admitting: Internal Medicine

## 2018-12-15 ENCOUNTER — Other Ambulatory Visit (HOSPITAL_COMMUNITY): Payer: Self-pay | Admitting: Internal Medicine

## 2018-12-15 DIAGNOSIS — K5909 Other constipation: Secondary | ICD-10-CM | POA: Diagnosis not present

## 2018-12-15 DIAGNOSIS — R55 Syncope and collapse: Secondary | ICD-10-CM

## 2018-12-15 DIAGNOSIS — E063 Autoimmune thyroiditis: Secondary | ICD-10-CM | POA: Diagnosis not present

## 2018-12-15 DIAGNOSIS — E039 Hypothyroidism, unspecified: Secondary | ICD-10-CM | POA: Diagnosis not present

## 2018-12-15 DIAGNOSIS — E7849 Other hyperlipidemia: Secondary | ICD-10-CM | POA: Diagnosis not present

## 2018-12-16 ENCOUNTER — Telehealth: Payer: Self-pay | Admitting: Cardiology

## 2018-12-16 NOTE — Telephone Encounter (Signed)
Virtual Visit Pre-Appointment Phone Call  "(Name), I am calling you today to discuss your upcoming appointment. We are currently trying to limit exposure to the virus that causes COVID-19 by seeing patients at home rather than in the office."  1. "What is the BEST phone number to call the day of the visit?" - include this in appointment notes  2. Do you have or have access to (through a family member/friend) a smartphone with video capability that we can use for your visit?" a. If yes - list this number in appt notes as cell (if different from BEST phone #) and list the appointment type as a VIDEO visit in appointment notes b. If no - list the appointment type as a PHONE visit in appointment notes  3. Confirm consent - "In the setting of the current Covid19 crisis, you are scheduled for a (phone or video) visit with your provider on (date) at (time).  Just as we do with many in-office visits, in order for you to participate in this visit, we must obtain consent.  If you'd like, I can send this to your mychart (if signed up) or email for you to review.  Otherwise, I can obtain your verbal consent now.  All virtual visits are billed to your insurance company just like a normal visit would be.  By agreeing to a virtual visit, we'd like you to understand that the technology does not allow for your provider to perform an examination, and thus may limit your provider's ability to fully assess your condition. If your provider identifies any concerns that need to be evaluated in person, we will make arrangements to do so.  Finally, though the technology is pretty good, we cannot assure that it will always work on either your or our end, and in the setting of a video visit, we may have to convert it to a phone-only visit.  In either situation, we cannot ensure that we have a secure connection.  Are you willing to proceed?" STAFF: Did the patient verbally acknowledge consent to telehealth visit? Document  YES/NO here: Yes  4. Advise patient to be prepared - "Two hours prior to your appointment, go ahead and check your blood pressure, pulse, oxygen saturation, and your weight (if you have the equipment to check those) and write them all down. When your visit starts, your provider will ask you for this information. If you have an Apple Watch or Kardia device, please plan to have heart rate information ready on the day of your appointment. Please have a pen and paper handy nearby the day of the visit as well."  5. Give patient instructions for MyChart download to smartphone OR [email or web address] as below if video visit (depending on what platform provider is using)  6. Inform patient they will receive a phone call 15 minutes prior to their appointment time (may be from unknown caller ID) so they should be prepared to answer    TELEPHONE CALL NOTE  [name] has been deemed a candidate for a follow-up tele-health visit to limit community exposure during the Covid-19 pandemic. I spoke with the patient via phone to ensure availability of phone/video source, confirm preferred email & phone number, and discuss instructions and expectations.  I reminded [name] to be prepared with any vital sign and/or heart rhythm information that could potentially be obtained via home monitoring, at the time of his visit. I reminded [name] to expect a phone call prior to  his visit.  [name] 12/16/2018 3:09 PM

## 2018-12-21 ENCOUNTER — Telehealth (INDEPENDENT_AMBULATORY_CARE_PROVIDER_SITE_OTHER): Payer: Medicare Other | Admitting: Cardiology

## 2018-12-21 ENCOUNTER — Other Ambulatory Visit: Payer: Self-pay

## 2018-12-21 ENCOUNTER — Encounter: Payer: Self-pay | Admitting: Cardiology

## 2018-12-21 VITALS — BP 156/84 | HR 81 | Ht 71.0 in | Wt 192.0 lb

## 2018-12-21 DIAGNOSIS — Z87898 Personal history of other specified conditions: Secondary | ICD-10-CM | POA: Diagnosis not present

## 2018-12-21 DIAGNOSIS — I739 Peripheral vascular disease, unspecified: Secondary | ICD-10-CM

## 2018-12-21 DIAGNOSIS — I6521 Occlusion and stenosis of right carotid artery: Secondary | ICD-10-CM

## 2018-12-21 DIAGNOSIS — E782 Mixed hyperlipidemia: Secondary | ICD-10-CM

## 2018-12-21 NOTE — Patient Instructions (Signed)
Medication Instructions:  Your physician recommends that you continue on your current medications as directed. Please refer to the Current Medication list given to you today.   Labwork: I will request labs from pcp   Testing/Procedures: none  Follow-Up: Your physician recommends that you schedule a follow-up appointment in: pending current test (Carotid Doppler)   Any Other Special Instructions Will Be Listed Below (If Applicable).     If you need a refill on your cardiac medications before your next appointment, please call your pharmacy.
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
## 2018-12-23 ENCOUNTER — Ambulatory Visit (HOSPITAL_COMMUNITY)
Admission: RE | Admit: 2018-12-23 | Discharge: 2018-12-23 | Disposition: A | Discharge status: Home / Self Care | Payer: Medicare Other | Source: Ambulatory Visit | Attending: Internal Medicine | Admitting: Internal Medicine

## 2018-12-23 ENCOUNTER — Other Ambulatory Visit: Payer: Self-pay

## 2018-12-23 DIAGNOSIS — I6523 Occlusion and stenosis of bilateral carotid arteries: Secondary | ICD-10-CM | POA: Diagnosis not present

## 2018-12-23 DIAGNOSIS — R55 Syncope and collapse: Secondary | ICD-10-CM | POA: Insufficient documentation

## 2019-04-18 IMAGING — CT CT HEAD W/O CM
3 series · 15 of 47 positions shown, 18 images · non-contrast
Comparison: CT head without contrast 02/12/2010

CLINICAL DATA: Syncopal episode this morning. Hit head on dresser.
Laceration of forehead. Patient has had syncopal episodes previous
related to upper respiratory infection.

EXAM:
CT HEAD WITHOUT CONTRAST
TECHNIQUE: Contiguous axial images were obtained from the base of the skull
through the vertex without intravenous contrast.

[Series 2: head trauma wo · axial · 0.45mm/px · z∈[+1424,+1564]mm · 9 of 34 slices shown, 12 images]
[im 3/34  brain]
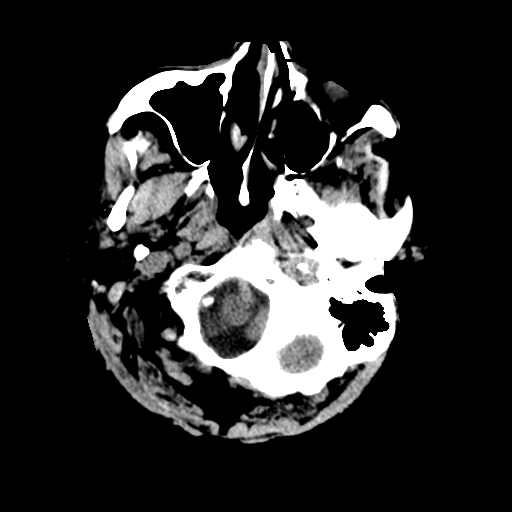
[im 3/34  bone]
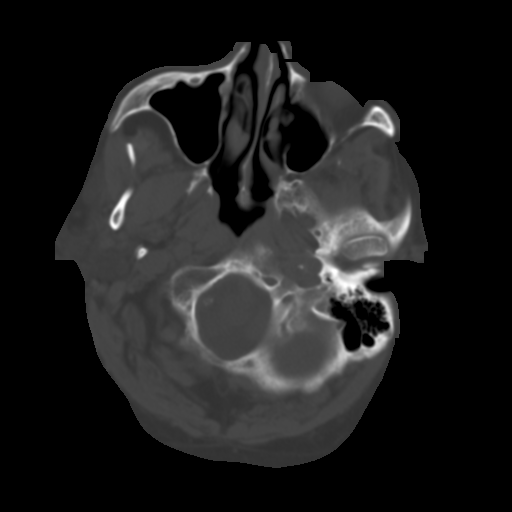
[im 6/34  brain]
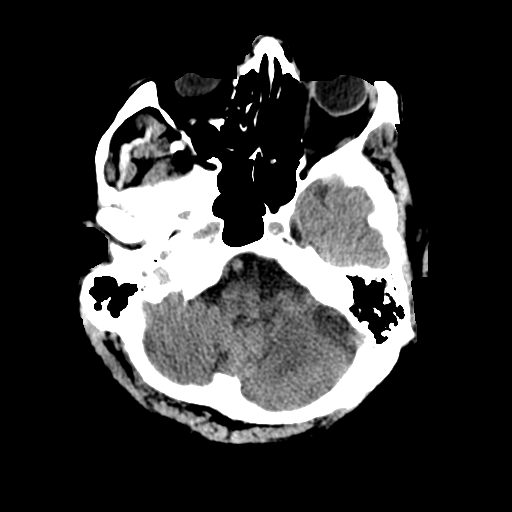
[im 10/34  brain]
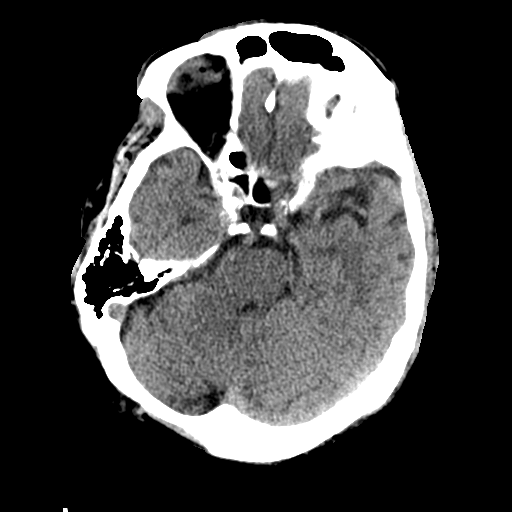
[im 13/34  brain]
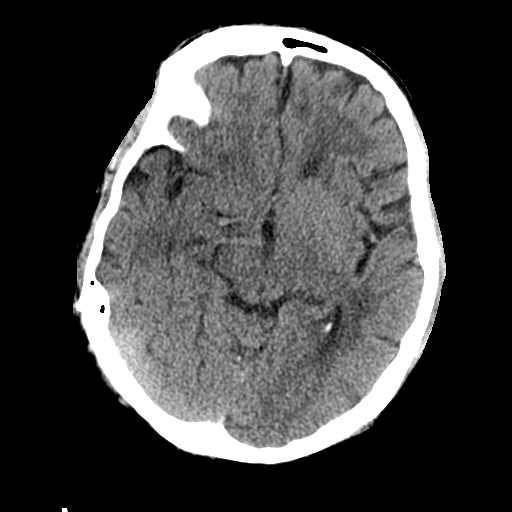
[im 18/34  brain]
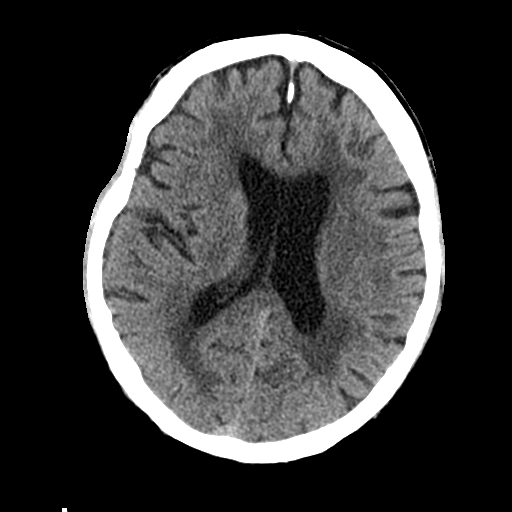
[im 18/34  bone]
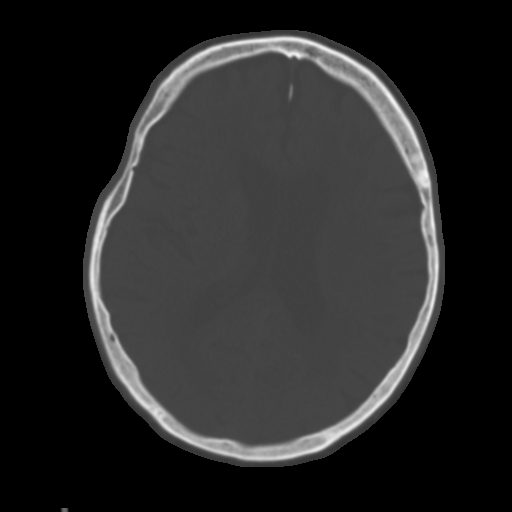
[im 21/34  brain]
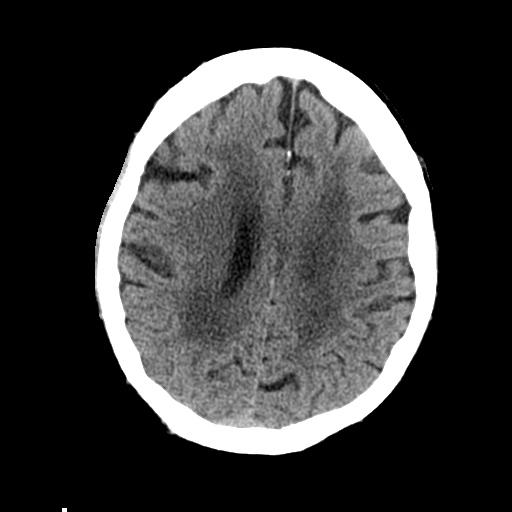
[im 24/34  brain]
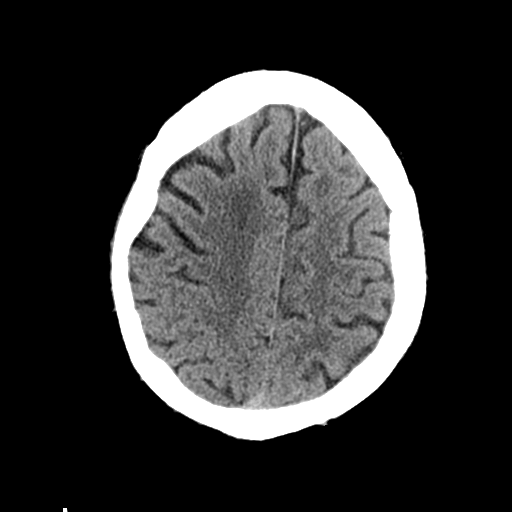
[im 28/34  brain]
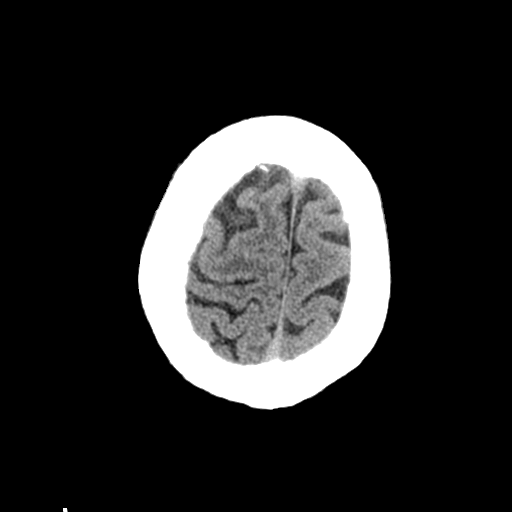
[im 31/34  brain]
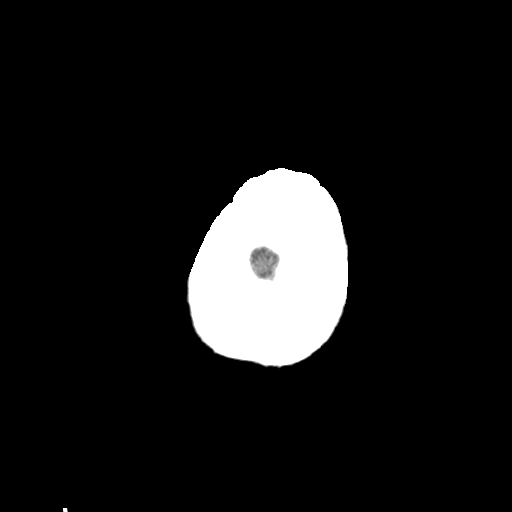
[im 31/34  bone]
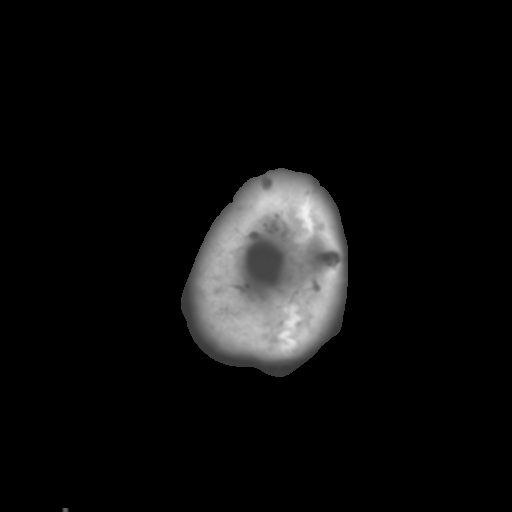

[Series 4: coronal soft tissue · coronal · 0.34mm/px · 3 of 72 slices shown]
[im 24/72  brain]
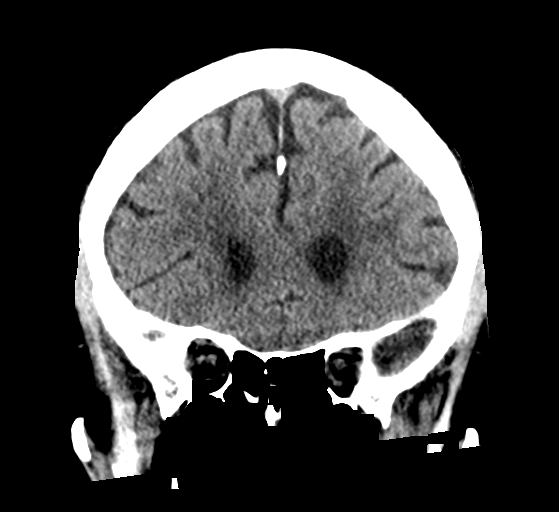
[im 32/72  brain]
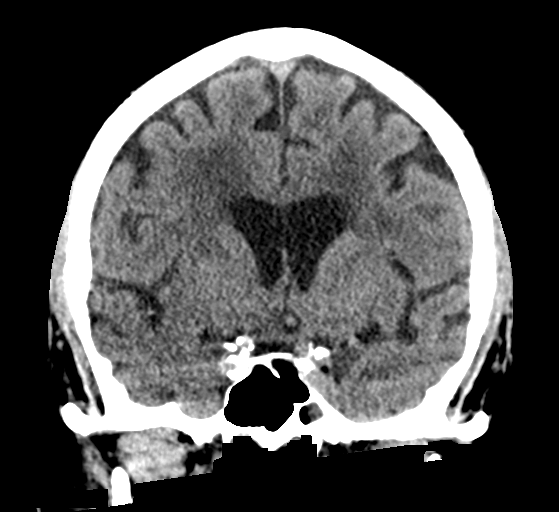
[im 40/72  brain]
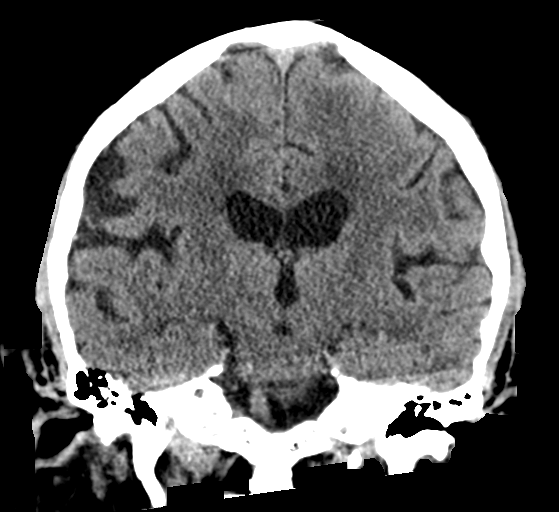

[Series 5: sagittal soft tissue · sagittal · 0.35mm/px · 3 of 67 slices shown]
[im 26/67  brain]
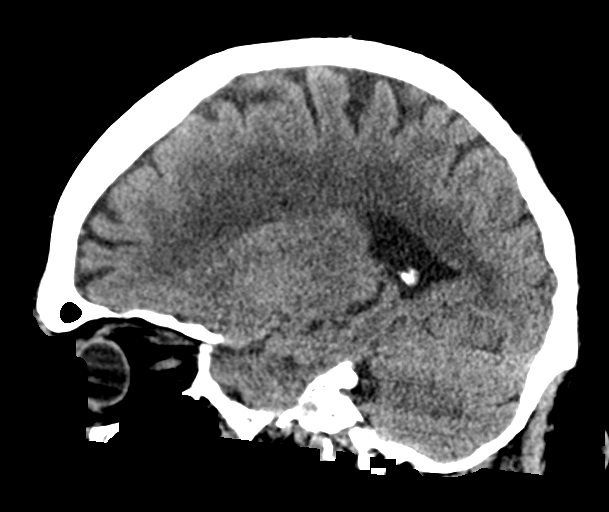
[im 34/67  brain]
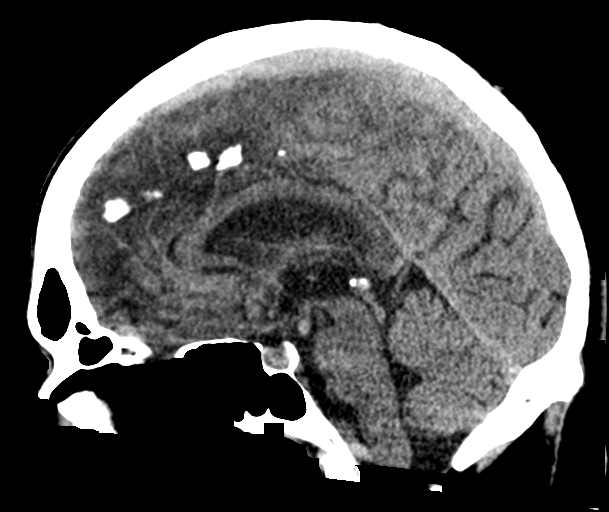
[im 41/67  brain]
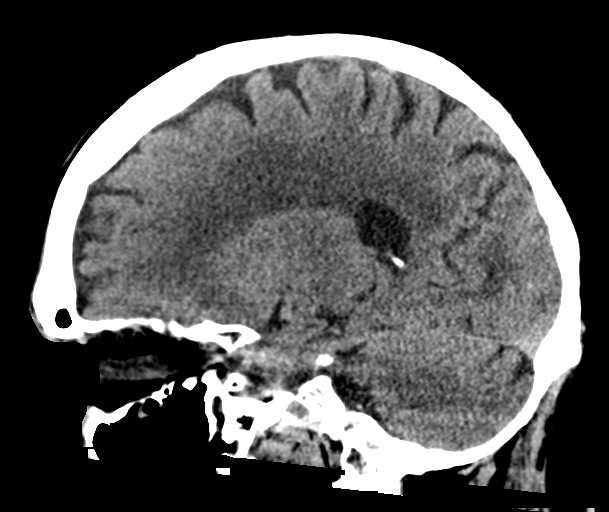

[15 of 47 positions shown; findings below may reference images not displayed]

FINDINGS: Brain: Progressive moderate atrophy and white matter disease is
present. No acute infarct, hemorrhage, or mass lesion is present.
Basal ganglia are intact. Insular ribbon is normal bilaterally. No
significant extra-axial fluid collection is present. The ventricles
are proportionate to the degree of atrophy. The brainstem and
cerebellum are normal.

Vascular: Atherosclerotic calcifications are present within the
cavernous internal carotid arteries bilaterally. There is no
asymmetric hyperdense vessel.

Skull: Supraorbital soft tissue swelling is present without an
underlying fracture. No radiopaque foreign body is present. The
visualized facial bones are intact.

Sinuses/Orbits: A small fluid level is present in the sphenoid sinus
paranasal sinuses and mastoid air cells are otherwise clear. Globes
and orbits are within normal limits bilaterally.
IMPRESSION: 1. Supraorbital soft tissue swelling without underlying fracture.
2. No acute intracranial abnormality.
3. Progressive atrophy and white matter disease likely reflects the
sequela of chronic microvascular ischemia.
4. Atherosclerosis.
5. Small fluid level in left sphenoid sinus is likely inflammatory.

## 2019-04-25 DIAGNOSIS — E063 Autoimmune thyroiditis: Secondary | ICD-10-CM | POA: Diagnosis not present

## 2019-04-25 DIAGNOSIS — I739 Peripheral vascular disease, unspecified: Secondary | ICD-10-CM | POA: Diagnosis not present

## 2019-04-25 DIAGNOSIS — N182 Chronic kidney disease, stage 2 (mild): Secondary | ICD-10-CM | POA: Diagnosis not present

## 2019-04-25 DIAGNOSIS — I129 Hypertensive chronic kidney disease with stage 1 through stage 4 chronic kidney disease, or unspecified chronic kidney disease: Secondary | ICD-10-CM | POA: Diagnosis not present

## 2019-06-12 ENCOUNTER — Emergency Department (HOSPITAL_COMMUNITY): Payer: Medicare Other

## 2019-06-12 ENCOUNTER — Other Ambulatory Visit: Payer: Self-pay

## 2019-06-12 ENCOUNTER — Inpatient Hospital Stay (HOSPITAL_COMMUNITY)
Admission: EM | Admit: 2019-06-12 | Discharge: 2019-06-14 | DRG: 884 | Disposition: A | Discharge status: Home Health | Payer: Medicare Other | Attending: Family Medicine | Admitting: Family Medicine

## 2019-06-12 ENCOUNTER — Encounter (HOSPITAL_COMMUNITY): Payer: Self-pay

## 2019-06-12 DIAGNOSIS — Z79890 Hormone replacement therapy (postmenopausal): Secondary | ICD-10-CM | POA: Diagnosis not present

## 2019-06-12 DIAGNOSIS — H9190 Unspecified hearing loss, unspecified ear: Secondary | ICD-10-CM | POA: Diagnosis present

## 2019-06-12 DIAGNOSIS — R29700 NIHSS score 0: Secondary | ICD-10-CM | POA: Diagnosis present

## 2019-06-12 DIAGNOSIS — F05 Delirium due to known physiological condition: Secondary | ICD-10-CM | POA: Diagnosis present

## 2019-06-12 DIAGNOSIS — E538 Deficiency of other specified B group vitamins: Secondary | ICD-10-CM | POA: Diagnosis not present

## 2019-06-12 DIAGNOSIS — I10 Essential (primary) hypertension: Secondary | ICD-10-CM | POA: Diagnosis not present

## 2019-06-12 DIAGNOSIS — R05 Cough: Secondary | ICD-10-CM | POA: Diagnosis not present

## 2019-06-12 DIAGNOSIS — Z20822 Contact with and (suspected) exposure to covid-19: Secondary | ICD-10-CM | POA: Diagnosis not present

## 2019-06-12 DIAGNOSIS — E785 Hyperlipidemia, unspecified: Secondary | ICD-10-CM | POA: Diagnosis not present

## 2019-06-12 DIAGNOSIS — E039 Hypothyroidism, unspecified: Secondary | ICD-10-CM | POA: Diagnosis not present

## 2019-06-12 DIAGNOSIS — I639 Cerebral infarction, unspecified: Secondary | ICD-10-CM

## 2019-06-12 DIAGNOSIS — R29818 Other symptoms and signs involving the nervous system: Secondary | ICD-10-CM | POA: Diagnosis not present

## 2019-06-12 DIAGNOSIS — Z720 Tobacco use: Secondary | ICD-10-CM | POA: Diagnosis present

## 2019-06-12 DIAGNOSIS — R410 Disorientation, unspecified: Secondary | ICD-10-CM

## 2019-06-12 DIAGNOSIS — Z8679 Personal history of other diseases of the circulatory system: Secondary | ICD-10-CM

## 2019-06-12 DIAGNOSIS — I679 Cerebrovascular disease, unspecified: Secondary | ICD-10-CM | POA: Diagnosis not present

## 2019-06-12 DIAGNOSIS — Z7982 Long term (current) use of aspirin: Secondary | ICD-10-CM | POA: Diagnosis not present

## 2019-06-12 DIAGNOSIS — I2510 Atherosclerotic heart disease of native coronary artery without angina pectoris: Secondary | ICD-10-CM | POA: Diagnosis present

## 2019-06-12 DIAGNOSIS — Z8673 Personal history of transient ischemic attack (TIA), and cerebral infarction without residual deficits: Secondary | ICD-10-CM

## 2019-06-12 DIAGNOSIS — I739 Peripheral vascular disease, unspecified: Secondary | ICD-10-CM | POA: Diagnosis present

## 2019-06-12 DIAGNOSIS — I6521 Occlusion and stenosis of right carotid artery: Secondary | ICD-10-CM | POA: Diagnosis present

## 2019-06-12 DIAGNOSIS — Z800 Family history of malignant neoplasm of digestive organs: Secondary | ICD-10-CM

## 2019-06-12 DIAGNOSIS — I6381 Other cerebral infarction due to occlusion or stenosis of small artery: Secondary | ICD-10-CM | POA: Diagnosis not present

## 2019-06-12 DIAGNOSIS — F17210 Nicotine dependence, cigarettes, uncomplicated: Secondary | ICD-10-CM | POA: Diagnosis present

## 2019-06-12 DIAGNOSIS — R42 Dizziness and giddiness: Secondary | ICD-10-CM | POA: Diagnosis not present

## 2019-06-12 DIAGNOSIS — F0150 Vascular dementia without behavioral disturbance: Secondary | ICD-10-CM | POA: Diagnosis present

## 2019-06-12 DIAGNOSIS — G9340 Encephalopathy, unspecified: Secondary | ICD-10-CM | POA: Diagnosis present

## 2019-06-12 DIAGNOSIS — R7301 Impaired fasting glucose: Secondary | ICD-10-CM | POA: Diagnosis not present

## 2019-06-12 DIAGNOSIS — G459 Transient cerebral ischemic attack, unspecified: Secondary | ICD-10-CM | POA: Diagnosis not present

## 2019-06-12 DIAGNOSIS — I779 Disorder of arteries and arterioles, unspecified: Secondary | ICD-10-CM | POA: Diagnosis present

## 2019-06-12 DIAGNOSIS — I6389 Other cerebral infarction: Secondary | ICD-10-CM | POA: Diagnosis not present

## 2019-06-12 DIAGNOSIS — R269 Unspecified abnormalities of gait and mobility: Secondary | ICD-10-CM

## 2019-06-12 DIAGNOSIS — E7849 Other hyperlipidemia: Secondary | ICD-10-CM | POA: Diagnosis not present

## 2019-06-12 LAB — COMPREHENSIVE METABOLIC PANEL
ALT: 11 U/L (ref 0–44)
AST: 15 U/L (ref 15–41)
Albumin: 4.4 g/dL (ref 3.5–5.0)
Alkaline Phosphatase: 33 U/L — ABNORMAL LOW (ref 38–126)
Anion gap: 9 (ref 5–15)
BUN: 10 mg/dL (ref 8–23)
CO2: 28 mmol/L (ref 22–32)
Calcium: 9.4 mg/dL (ref 8.9–10.3)
Chloride: 101 mmol/L (ref 98–111)
Creatinine, Ser: 1.21 mg/dL (ref 0.61–1.24)
GFR calc Af Amer: >60 mL/min (ref >60)
GFR calc non Af Amer: 58 mL/min — ABNORMAL LOW (ref >60)
Glucose, Bld: 118 mg/dL — ABNORMAL HIGH (ref 70–99)
Potassium: 4.3 mmol/L (ref 3.5–5.1)
Sodium: 138 mmol/L (ref 135–145)
Total Bilirubin: 0.8 mg/dL (ref 0.3–1.2)
Total Protein: 7.4 g/dL (ref 6.5–8.1)

## 2019-06-12 LAB — DIFFERENTIAL
Abs Immature Granulocytes: 0.05 10*3/uL (ref 0.00–0.07)
Basophils Absolute: 0 10*3/uL (ref 0.0–0.1)
Basophils Relative: 0 %
Eosinophils Absolute: 0.1 10*3/uL (ref 0.0–0.5)
Eosinophils Relative: 1 %
Immature Granulocytes: 0 %
Lymphocytes Relative: 7 %
Lymphs Abs: 0.8 10*3/uL (ref 0.7–4.0)
Monocytes Absolute: 0.5 10*3/uL (ref 0.1–1.0)
Monocytes Relative: 4 %
Neutro Abs: 11.3 10*3/uL — ABNORMAL HIGH (ref 1.7–7.7)
Neutrophils Relative %: 88 %

## 2019-06-12 LAB — RAPID URINE DRUG SCREEN, HOSP PERFORMED
Amphetamines: NOT DETECTED
Barbiturates: NOT DETECTED
Benzodiazepines: NOT DETECTED
Cocaine: NOT DETECTED
Opiates: NOT DETECTED
Tetrahydrocannabinol: NOT DETECTED

## 2019-06-12 LAB — VITAMIN B12: Vitamin B-12: <50 pg/mL — ABNORMAL LOW (ref 180–914)

## 2019-06-12 LAB — URINALYSIS, ROUTINE W REFLEX MICROSCOPIC
Bilirubin Urine: NEGATIVE
Glucose, UA: NEGATIVE mg/dL
Hgb urine dipstick: NEGATIVE
Ketones, ur: NEGATIVE mg/dL
Leukocytes,Ua: NEGATIVE
Nitrite: NEGATIVE
Protein, ur: NEGATIVE mg/dL
Specific Gravity, Urine: 1.008 (ref 1.005–1.030)
pH: 7 (ref 5.0–8.0)

## 2019-06-12 LAB — CBC
HCT: 46.7 % (ref 39.0–52.0)
Hemoglobin: 15.7 g/dL (ref 13.0–17.0)
MCH: 37.2 pg — ABNORMAL HIGH (ref 26.0–34.0)
MCHC: 33.6 g/dL (ref 30.0–36.0)
MCV: 110.7 fL — ABNORMAL HIGH (ref 80.0–100.0)
Platelets: 254 10*3/uL (ref 150–400)
RBC: 4.22 MIL/uL (ref 4.22–5.81)
RDW: 13.3 % (ref 11.5–15.5)
WBC: 12.8 10*3/uL — ABNORMAL HIGH (ref 4.0–10.5)
nRBC: 0 % (ref 0.0–0.2)

## 2019-06-12 LAB — PROTIME-INR
INR: 0.9 (ref 0.8–1.2)
Prothrombin Time: 12.2 seconds (ref 11.4–15.2)

## 2019-06-12 LAB — APTT: aPTT: 29 seconds (ref 24–36)

## 2019-06-12 LAB — MAGNESIUM: Magnesium: 2 mg/dL (ref 1.7–2.4)

## 2019-06-12 LAB — ETHANOL: Alcohol, Ethyl (B): <10 mg/dL (ref <10)

## 2019-06-12 LAB — CBG MONITORING, ED: Glucose-Capillary: 79 mg/dL (ref 70–99)

## 2019-06-12 LAB — TROPONIN I (HIGH SENSITIVITY)
Troponin I (High Sensitivity): 8 ng/L (ref <18)
Troponin I (High Sensitivity): 7 ng/L (ref <18)

## 2019-06-12 LAB — PHOSPHORUS: Phosphorus: 3.4 mg/dL (ref 2.5–4.6)

## 2019-06-12 MED ORDER — ONDANSETRON HCL 4 MG/2ML IJ SOLN: 4.0000 mg | Freq: Four times a day (QID) | INTRAMUSCULAR | Status: DC | PRN

## 2019-06-12 MED ORDER — ONDANSETRON HCL 4 MG PO TABS: 4.0000 mg | ORAL_TABLET | Freq: Four times a day (QID) | ORAL | Status: DC | PRN

## 2019-06-12 MED ORDER — ASPIRIN 325 MG PO TABS
325.0000 mg | ORAL_TABLET | Freq: Every day | ORAL | Status: DC
  Administered 2019-06-13 – 2019-06-14 (×2): 325 mg via ORAL
  Filled 2019-06-12 (×2): qty 1

## 2019-06-12 MED ORDER — STROKE: EARLY STAGES OF RECOVERY BOOK
Freq: Once | Status: DC
  Filled 2019-06-12: qty 1

## 2019-06-12 MED ORDER — CYANOCOBALAMIN 1000 MCG/ML IJ SOLN
1000.0000 ug | Freq: Once | INTRAMUSCULAR | Status: AC
  Administered 2019-06-13: 02:00:00 1000 ug via INTRAMUSCULAR
  Filled 2019-06-12 (×2): qty 1

## 2019-06-12 MED ORDER — ASPIRIN 300 MG RE SUPP: 300.0000 mg | Freq: Every day | RECTAL | Status: DC
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
## 2019-06-12 NOTE — ED Triage Notes (Addendum)
Pt wife pt became confused unable to follow commands at 6pm this evening. Said he was cold and clammy. 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 Pt c/o dizziness and fell. Then had trouble standing.  Does not have Diabetes CBG 79. Pt currently able to follow commands. Equal grips. Unsure of date. Said every now and then he has episodes of confusion. Does have hx of blood clot in leg.   Wife gave him 324 baby aspirin.
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
## 2019-06-12 NOTE — Consult Note (Signed)
[facility] TeleNeurology Consult Services  Stat Consult  Date of Service:   06/12/2019 20:07:13  Impression:     .  R42 - Dizziness/ Vertigo/ Giddiness  Comments/Sign-Out: 79 yr old man, with dizziness, BG was 79, he was cold, clammy, brief syncope. All symptoms resolved in ER. He had CT head - microvascular changes, no acute change. He has R BG lacune. NIH 0. Diff Dx: r/o CVA, syncope, cardiac syncope. D/w ER re: admission, CVA work up, ASA treatment.  CT HEAD: Showed No Acute Hemorrhage or Acute Core Infarct Reviewed microvascular changes, no acute changes, R BG lacune, age indeterminant  Metrics: [facility] Notification Time: 06/12/2019 20:05:03 Stamp Time: 06/12/2019 20:07:13 Callback Response Time: 06/12/2019 20:08:56  Our recommendations are outlined below.  Recommendations:     .  Antiplatelet Therapy  Imaging Studies:     .  MRI Head     .  MRA Head Without Contrast     .  MRA Head and Neck Without Contrast When Available - Stroke Protocol     .  Echocardiogram - Transthoracic Echocardiogram  Therapies:     .  Physical Therapy, Occupational Therapy, Speech Therapy Assessment When Applicable  Other WorkUp:     .  Check B12 level  Disposition: Neurology Follow Up Recommended  Sign Out:     .  Discussed with Emergency Department Provider  ----------------------------------------------------------------------------------------------------  Chief Complaint: dizziness  History of Present Illness: Patient is a 79 year old Male.  79 yr old man, with hx of HTN, thyroid, and recent cognitive impairment per wife at bedside, who was working with nephew this evening and around 6 pm, started with dizziness, confusion, he fell, ? brief syncope. He was noted to be cold and clammy. EMS came and glucose was 79. HE was brought to ER. He has no symptoms now. He has hearing loss, chronic, and per wife, ongoing confusion in last year. He denied focal  weakness , numbness, tingling, no speech, vision changes.   Past Medical History:     . Hypertension   Antiplatelet use: asa    Examination: BP(141/70), Pulse(89), Blood Glucose(79) 1A: Level of Consciousness - Alert; keenly responsive + 0 1B: Ask Month and Age - Both Questions Right + 0 1C: Blink Eyes & Squeeze Hands - Performs Both Tasks + 0 2: Test Horizontal Extraocular Movements - Normal + 0 3: Test Visual Fields - No Visual Loss + 0 4: Test Facial Palsy (Use Grimace if Obtunded) - Normal symmetry + 0 5A: Test Left Arm Motor Drift - No Drift for 10 Seconds + 0 5B: Test Right Arm Motor Drift - No Drift for 10 Seconds + 0 6A: Test Left Leg Motor Drift - No Drift for 5 Seconds + 0 6B: Test Right Leg Motor Drift - No Drift for 5 Seconds + 0 7: Test Limb Ataxia (FNF/Heel-Shin) - No Ataxia + 0 8: Test Sensation - Normal; No sensory loss + 0 9: Test Language/Aphasia - Normal; No aphasia + 0 10: Test Dysarthria - Normal + 0 11: Test Extinction/Inattention - No abnormality + 0  NIHSS Score: 0   Patient/Family was informed the Neurology Consult would occur via TeleHealth consult by way of interactive audio and video telecommunications and consented to receiving care in this manner.  Due to the immediate potential for life-threatening deterioration due to underlying acute neurologic illness, I spent 25 minutes providing critical care. This time includes time for face to face visit via telemedicine, review of medical records, imaging  studies and discussion of findings with providers, the patient and/or family.   Dr Lloyd Huger   [facility] [phone]  Case [id number]

## 2019-06-13 ENCOUNTER — Inpatient Hospital Stay (HOSPITAL_COMMUNITY): Payer: Medicare Other

## 2019-06-13 DIAGNOSIS — E7849 Other hyperlipidemia: Secondary | ICD-10-CM | POA: Diagnosis not present

## 2019-06-13 DIAGNOSIS — R7301 Impaired fasting glucose: Secondary | ICD-10-CM | POA: Diagnosis not present

## 2019-06-13 LAB — TSH: TSH: 1.138 u[IU]/mL (ref 0.350–4.500)

## 2019-06-13 LAB — SARS CORONAVIRUS 2 (TAT 6-24 HRS): SARS Coronavirus 2: NEGATIVE

## 2019-06-13 LAB — HEMOGLOBIN A1C
Hgb A1c MFr Bld: 4.9 % (ref 4.8–5.6)
Mean Plasma Glucose: 93.93 mg/dL

## 2019-06-13 LAB — LIPID PANEL
Cholesterol: 225 mg/dL — ABNORMAL HIGH (ref 0–200)
HDL: 46 mg/dL (ref >40)
LDL Cholesterol: 167 mg/dL — ABNORMAL HIGH (ref 0–99)
Total CHOL/HDL Ratio: 4.9 RATIO
Triglycerides: 58 mg/dL (ref <150)
VLDL: 12 mg/dL (ref 0–40)

## 2019-06-13 MED ORDER — HYDROXYZINE HCL 25 MG PO TABS
25.0000 mg | ORAL_TABLET | Freq: Once | ORAL | Status: AC
  Administered 2019-06-13: 21:00:00 25 mg via ORAL
  Filled 2019-06-13: qty 1

## 2019-06-13 MED ORDER — VITAMIN B-12 1000 MCG PO TABS
1000.0000 ug | ORAL_TABLET | Freq: Every day | ORAL | Status: DC
  Administered 2019-06-13 – 2019-06-14 (×2): 1000 ug via ORAL
  Filled 2019-06-13 (×2): qty 1

## 2019-06-13 MED ORDER — GADOBUTROL 1 MMOL/ML IV SOLN
9.0000 mL | Freq: Once | INTRAVENOUS | Status: AC | PRN
  Administered 2019-06-13: 07:00:00 9 mL via INTRAVENOUS

## 2019-06-13 MED ORDER — LEVOTHYROXINE SODIUM 100 MCG PO TABS
100.0000 ug | ORAL_TABLET | Freq: Every day | ORAL | Status: DC
  Administered 2019-06-13 – 2019-06-14 (×2): 100 ug via ORAL
  Filled 2019-06-13 (×2): qty 1

## 2019-06-13 MED ORDER — NICOTINE 21 MG/24HR TD PT24
21.0000 mg | MEDICATED_PATCH | Freq: Every day | TRANSDERMAL | Status: DC
  Administered 2019-06-13 – 2019-06-14 (×2): 21 mg via TRANSDERMAL
  Filled 2019-06-13 (×2): qty 1

## 2019-06-13 NOTE — Evaluation (Signed)
Occupational Therapy Evaluation Patient Details Name: [name] MRN: [id number] DOB: April 15, 1940 Today's Date: 06/13/2019    History of Present Illness KEYDON Hubbard is a 79 y.o. male with PMHx: HTN, CAD, PAD including carotid artery disease presenting with an episode of dizziness in confusion which has resolved. MRI revealing chronic basal ganglia infarct.   Clinical Impression   Pt PTA: Pt independent and living with spouse. Pt currently limited by decreased balance, weakness/coordination noted in LUE, decreased cognition, and decreased awareness of deficits. Pt requiring cues to sequence through a grooming task and assist for a figure ground task. Unable to properly assess vision as pt's inattention too severe. Pt minguardA overall for ADL and mobility.  Pt would benefit from continued OT skilled services. OT following acutely.       Follow Up Recommendations  Outpatient OT;Supervision/Assistance - 24 hour(initially)    Equipment Recommendations  None recommended by OT    Recommendations for Other Services       Precautions / Restrictions Precautions Precautions: Fall Restrictions Weight Bearing Restrictions: No      Mobility Bed Mobility               General bed mobility comments: up in recliner  Transfers Overall transfer level: Needs assistance Equipment used: None Transfers: Sit to/from Stand Sit to Stand: Min guard         General transfer comment: momentum to stand    Balance Overall balance assessment: Needs assistance Sitting-balance support: Feet supported;No upper extremity supported Sitting balance-Leahy Scale: Fair     Standing balance support: No upper extremity supported Standing balance-Leahy Scale: Fair Standing balance comment: performing grooming at sink in standing                           ADL either performed or assessed with clinical judgement   ADL Overall ADL's : Needs  assistance/impaired Eating/Feeding: Set up;Sitting   Grooming: Supervision/safety;Standing Grooming Details (indicate cue type and reason): Pt requiring cues to find the toothbrush and toothpaste during figure ground task at sink. Pt able to identify item with less items in bin to choose from. Upper Body Bathing: Set up;Standing   Lower Body Bathing: Min guard;Sitting/lateral leans;Sit to/from stand   Upper Body Dressing : Set up;Standing   Lower Body Dressing: Min guard;Cueing for safety;Cueing for sequencing;Sitting/lateral leans;Sit to/from stand   Toilet Transfer: Min guard;Cueing for safety;Ambulation   Toileting- Clothing Manipulation and Hygiene: Min guard;Cueing for safety;Sitting/lateral lean;Sit to/from stand       Functional mobility during ADLs: Min guard;Cueing for safety General ADL Comments: Pt limited by decreased balance,  weakness/coordination noted in LUE, decreased cognition, and decreased awareness of deficits..     Vision Baseline Vision/History: No visual deficits Patient Visual Report: No change from baseline Vision Assessment?: Yes Eye Alignment: Within Functional Limits Ocular Range of Motion: Within Functional Limits Alignment/Gaze Preference: Within Defined Limits Tracking/Visual Pursuits: Impaired - to be further tested in functional context Additional Comments: pt with short attention span and able to scan with increased time     Perception     Praxis      Pertinent Vitals/Pain Pain Assessment: No/denies pain     Hand Dominance Right   Extremity/Trunk Assessment Upper Extremity Assessment Upper Extremity Assessment: Generalized weakness;RUE deficits/detail;LUE deficits/detail RUE Deficits / Details: 5/5 MM grade LUE Deficits / Details: 4+/5 to 5/5 MM grade LUE Coordination: decreased fine motor   Lower Extremity Assessment Lower Extremity Assessment: Generalized  weakness   Cervical / Trunk Assessment Cervical / Trunk Assessment:  Normal   Communication Communication Communication: HOH   Cognition Arousal/Alertness: Awake/alert Behavior During Therapy: WFL for tasks assessed/performed Overall Cognitive Status: History of cognitive impairments - at baseline Area of Impairment: Following commands;Safety/judgement;Problem solving;Orientation                 Orientation Level: Disoriented to;Place(with cues stating "cone")     Following Commands: Follows one step commands with increased time;Follows one step commands inconsistently;Follows multi-step commands inconsistently Safety/Judgement: Decreased awareness of safety;Decreased awareness of deficits   Problem Solving: Slow processing;Difficulty sequencing;Requires verbal cues;Requires tactile cues General Comments: Pt with delayed process requiring cues for sequencing after set-upA.    General Comments  VSS    Exercises     Shoulder Instructions      Home Living Family/patient expects to be discharged to:: Private residence Living Arrangements: Spouse/significant other Available Help at Discharge: Family;Available 24 hours/day Type of Home: House Home Access: Stairs to enter [employer] of Steps: 4 Entrance Stairs-Rails: Can reach both Home Layout: One level     Bathroom Shower/Tub: Teacher, early years/pre: Standard     Home Equipment: Cane - single point;Grab bars - tub/shower          Prior Functioning/Environment Level of Independence: Independent        Comments: reports not using AD and driving PTA        OT Problem List: Decreased activity tolerance;Decreased coordination;Decreased cognition;Decreased safety awareness;Impaired UE functional use      OT Treatment/Interventions: Self-care/ADL training;Therapeutic exercise;Neuromuscular education;Energy conservation;DME and/or AE instruction;Therapeutic activities;Cognitive remediation/compensation;Visual/perceptual  remediation/compensation;Patient/family education;Balance training    OT Goals(Current goals can be found in the care plan section) ADL Goals Additional ADL Goal #1: Pt will perform 1-3 multi-step commands with 90% accuracy adn fair safety awareness in 2/3 trials. Additional ADL Goal #2: Pt will perform OOB ADL with supervisionA and minimal cueing to attend to task or sequence through task.  OT Frequency: Min 2X/week   Barriers to D/C:            Co-evaluation              AM-PAC OT "6 Clicks" Daily Activity     Outcome Measure Help from another person eating meals?: None Help from another person taking care of personal grooming?: A Little Help from another person toileting, which includes using toliet, bedpan, or urinal?: A Little Help from another person bathing (including washing, rinsing, drying)?: A Little Help from another person to put on and taking off regular upper body clothing?: A Little Help from another person to put on and taking off regular lower body clothing?: None 6 Click Score: 20   End of Session Equipment Utilized During Treatment: Gait belt Nurse Communication: Mobility status  Activity Tolerance: Patient tolerated treatment well Patient left: in chair;with call bell/phone within reach;with chair alarm set  OT Visit Diagnosis: Unsteadiness on feet (R26.81);Muscle weakness (generalized) (M62.81);Other symptoms and signs involving cognitive function                Time: [id number] OT Time Calculation (min): 15 min Charges:  OT General Charges $OT Visit: 1 Visit OT Evaluation $OT Eval Moderate Complexity: 1 Mod  Jefferey Pica, OTR/L Acute Rehabilitation Services Pager: [phone] Office: [phone]   Jestine Bicknell C 06/13/2019, 1:57 PM
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
## 2019-06-14 ENCOUNTER — Inpatient Hospital Stay (HOSPITAL_COMMUNITY): Payer: Medicare Other

## 2019-06-14 ENCOUNTER — Encounter (HOSPITAL_COMMUNITY): Payer: Self-pay | Admitting: Internal Medicine

## 2019-06-14 DIAGNOSIS — I739 Peripheral vascular disease, unspecified: Secondary | ICD-10-CM

## 2019-06-14 DIAGNOSIS — G9340 Encephalopathy, unspecified: Secondary | ICD-10-CM

## 2019-06-14 DIAGNOSIS — F0150 Vascular dementia without behavioral disturbance: Secondary | ICD-10-CM | POA: Diagnosis present

## 2019-06-14 DIAGNOSIS — I6389 Other cerebral infarction: Secondary | ICD-10-CM

## 2019-06-14 DIAGNOSIS — F05 Delirium due to known physiological condition: Secondary | ICD-10-CM | POA: Diagnosis not present

## 2019-06-14 DIAGNOSIS — R410 Disorientation, unspecified: Secondary | ICD-10-CM

## 2019-06-14 DIAGNOSIS — Z8679 Personal history of other diseases of the circulatory system: Secondary | ICD-10-CM

## 2019-06-14 DIAGNOSIS — I679 Cerebrovascular disease, unspecified: Secondary | ICD-10-CM

## 2019-06-14 LAB — BASIC METABOLIC PANEL
Anion gap: 13 (ref 5–15)
BUN: 7 mg/dL — ABNORMAL LOW (ref 8–23)
CO2: 22 mmol/L (ref 22–32)
Calcium: 9.5 mg/dL (ref 8.9–10.3)
Chloride: 101 mmol/L (ref 98–111)
Creatinine, Ser: 1.16 mg/dL (ref 0.61–1.24)
GFR calc Af Amer: >60 mL/min (ref >60)
GFR calc non Af Amer: >60 mL/min (ref >60)
Glucose, Bld: 116 mg/dL — ABNORMAL HIGH (ref 70–99)
Potassium: 3.5 mmol/L (ref 3.5–5.1)
Sodium: 136 mmol/L (ref 135–145)

## 2019-06-14 LAB — CBC
HCT: 45.3 % (ref 39.0–52.0)
Hemoglobin: 15.7 g/dL (ref 13.0–17.0)
MCH: 37.4 pg — ABNORMAL HIGH (ref 26.0–34.0)
MCHC: 34.7 g/dL (ref 30.0–36.0)
MCV: 107.9 fL — ABNORMAL HIGH (ref 80.0–100.0)
Platelets: 274 10*3/uL (ref 150–400)
RBC: 4.2 MIL/uL — ABNORMAL LOW (ref 4.22–5.81)
RDW: 13.2 % (ref 11.5–15.5)
WBC: 9.2 10*3/uL (ref 4.0–10.5)
nRBC: 0 % (ref 0.0–0.2)

## 2019-06-14 LAB — VITAMIN D 25 HYDROXY (VIT D DEFICIENCY, FRACTURES): Vit D, 25-Hydroxy: 50.7 ng/mL (ref 30–100)

## 2019-06-14 LAB — ECHOCARDIOGRAM COMPLETE
Height: 71 in
Weight: 3078.4 oz

## 2019-06-14 MED ORDER — ASPIRIN 81 MG PO CHEW: 81.0000 mg | CHEWABLE_TABLET | Freq: Every day | ORAL | Status: DC

## 2019-06-14 MED ORDER — LISINOPRIL 5 MG PO TABS
2.5000 mg | ORAL_TABLET | Freq: Every day | ORAL | Status: DC
  Administered 2019-06-14: 12:00:00 2.5 mg via ORAL
  Filled 2019-06-14: qty 1

## 2019-06-14 MED ORDER — CYANOCOBALAMIN 2500 MCG PO TABS: 2500.0000 ug | ORAL_TABLET | Freq: Every day | ORAL | 1 refills | Status: DC

## 2019-06-14 MED ORDER — METOPROLOL SUCCINATE ER 25 MG PO TB24
25.0000 mg | ORAL_TABLET | Freq: Every day | ORAL | Status: DC
  Administered 2019-06-14: 12:00:00 25 mg via ORAL
  Filled 2019-06-14: qty 1

## 2019-06-14 MED ORDER — METOPROLOL SUCCINATE ER 25 MG PO TB24: 25.0000 mg | ORAL_TABLET | Freq: Every day | ORAL | 0 refills | Status: DC

## 2019-06-14 MED ORDER — LORAZEPAM 2 MG/ML IJ SOLN
0.5000 mg | Freq: Once | INTRAMUSCULAR | Status: AC
  Administered 2019-06-14: 01:00:00 0.5 mg via INTRAMUSCULAR
  Filled 2019-06-14: qty 1

## 2019-06-14 MED ORDER — LISINOPRIL 2.5 MG PO TABS: 2.5000 mg | ORAL_TABLET | Freq: Every day | ORAL | 0 refills | Status: AC

## 2019-06-14 MED ORDER — HALOPERIDOL LACTATE 5 MG/ML IJ SOLN
3.0000 mg | Freq: Four times a day (QID) | INTRAMUSCULAR | Status: DC | PRN
  Administered 2019-06-14: 3 mg via INTRAMUSCULAR
  Filled 2019-06-14: qty 1

## 2019-06-14 MED ORDER — MELATONIN 3 MG PO TABS
3.0000 mg | ORAL_TABLET | Freq: Once | ORAL | Status: AC
  Administered 2019-06-14: 01:00:00 3 mg via ORAL
  Filled 2019-06-14 (×2): qty 1

## 2019-06-14 NOTE — Progress Notes (Signed)
Physical Therapy Treatment Patient Details Name: [name] MRN: [id number] DOB: [birth year] Today's Date: 06/14/2019    History of Present Illness [name] is a 79 y.o. male with PMHx: HTN, CAD, PAD including carotid artery disease presenting with an episode of dizziness in confusion which has resolved. MRI revealing chronic basal ganglia infarct.    PT Comments    Patient more lethargic this session as had medications for sleep.  More off balance and needing RW for safety as well as noted to drag R foot.  Wife present and education in what to watch for and how to assist him for safety at home.  Feel stable for home with assist and follow as noted below.    Follow Up Recommendations  Home health PT;Supervision/Assistance - 24 hour     Equipment Recommendations  Rolling walker with 5" wheels    Recommendations for Other Services       Precautions / Restrictions Precautions Precautions: Fall    Mobility  Bed Mobility Overal bed mobility: Needs Assistance       Supine to sit: Min assist     General bed mobility comments: assist to initiate due to sleepy  Transfers Overall transfer level: Needs assistance Equipment used: Rolling walker (2 wheeled) Transfers: Sit to/from Stand Sit to Stand: Min guard         General transfer comment: for safety/balance, then performed x 5 with S for LE strength  Ambulation/Gait Ambulation/Gait assistance: Min assist Gait Distance (Feet): 200 Feet Assistive device: Rolling walker (2 wheeled) Gait Pattern/deviations: Step-to pattern;Step-through pattern;Decreased stride length;Decreased dorsiflexion - right;Trunk flexed;Wide base of support     General Gait Details: at times with turns stepping out of RW, dragging R foot more than L; wife in the room and educated on assistance for safety and set up for home safety   Stairs             Wheelchair Mobility    Modified Rankin (Stroke Patients  Only) Modified Rankin (Stroke Patients Only) Pre-Morbid Rankin Score: Slight disability Modified Rankin: Moderately severe disability     Balance Overall balance assessment: Needs assistance Sitting-balance support: Feet supported Sitting balance-Leahy Scale: Good     Standing balance support: No upper extremity supported Standing balance-Leahy Scale: Fair Standing balance comment: could walk in room no device with close guard A                            Cognition Arousal/Alertness: Lethargic Behavior During Therapy: Flat affect Overall Cognitive Status: Impaired/Different from baseline Area of Impairment: Following commands;Safety/judgement;Attention;Memory                   Current Attention Level: Sustained   Following Commands: Follows one step commands with increased time;Follows one step commands consistently Safety/Judgement: Decreased awareness of safety   Problem Solving: Slow processing;Requires verbal cues General Comments: Pt with delayed process requiring cues for sequencing after set-upA.       Exercises      General Comments General comments (skin integrity, edema, etc.): wife reports was unable to sleep so now sleepy as had medications to help      Pertinent Vitals/Pain Pain Assessment: No/denies pain    Home Living                      Prior Function            PT Goals (current goals can  now be found in the care plan section) Progress towards PT goals: Progressing toward goals    Frequency    Min 4X/week      PT Plan Discharge plan needs to be updated    Co-evaluation              AM-PAC PT "6 Clicks" Mobility   Outcome Measure  Help needed turning from your back to your side while in a flat bed without using bedrails?: None Help needed moving from lying on your back to sitting on the side of a flat bed without using bedrails?: A Little Help needed moving to and from a bed to a chair (including a  wheelchair)?: A Little Help needed standing up from a chair using your arms (e.g., wheelchair or bedside chair)?: A Little Help needed to walk in hospital room?: A Little Help needed climbing 3-5 steps with a railing? : A Little 6 Click Score: 19    End of Session   Activity Tolerance: Patient tolerated treatment well Patient left: in bed;with call bell/phone within reach;with family/visitor present Nurse Communication: Mobility status PT Visit Diagnosis: Unsteadiness on feet (R26.81);Difficulty in walking, not elsewhere classified (R26.2)     Time: [id number] PT Time Calculation (min) (ACUTE ONLY): 22 min  Charges:  $Gait Training: 8-22 mins                     Magda Kiel, [place] [phone] 06/14/2019    Reginia Naas 06/14/2019, 5:30 PM

## 2019-06-14 NOTE — Progress Notes (Signed)
Pt very agitated, verbally and physically. Trying to hit and punch staff members and cursing at them. Trying to get up and walk out of room, very hard to redirect pt. Pt at risk for harm to himself and staff members. Restraints were removed at 0800, they were making pt more agitated. Dr. Wynetta Emery paged. He put orders in for haldol IM, and orders for a safety sitter, house coverage has been called, there is no safety sitters available, this RN has been sitting in room with pt. Wife Vaughan Basta was called around 0900, she was supposed to be coming in. Will continue to monitor.

## 2019-06-14 NOTE — Discharge Instructions (Signed)
Dizziness Dizziness is a common problem. It makes you feel unsteady or light-headed. You may feel like you are about to pass out (faint). Dizziness can lead to getting hurt if you stumble or fall. Dizziness can be caused by many things, including:  Medicines.  Not having enough water in your body (dehydration).  Illness. Follow these instructions at home: Eating and drinking   Drink enough fluid to keep your pee (urine) clear or pale yellow. This helps to keep you from getting dehydrated. Try to drink more clear fluids, such as water.  Do not drink alcohol.  Limit how much caffeine you drink or eat, if your doctor tells you to do that.  Limit how much salt (sodium) you drink or eat, if your doctor tells you to do that. Activity   Avoid making quick movements. ? When you stand up from sitting in a chair, steady yourself until you feel okay. ? In the morning, first sit up on the side of the bed. When you feel okay, stand slowly while you hold onto something. Do this until you know that your balance is fine.  If you need to stand in one place for a long time, move your legs often. Tighten and relax the muscles in your legs while you are standing.  Do not drive or use heavy machinery if you feel dizzy.  Avoid bending down if you feel dizzy. Place items in your home so you can reach them easily without leaning over. Lifestyle  Do not use any products that contain nicotine or tobacco, such as cigarettes and e-cigarettes. If you need help quitting, ask your doctor.  Try to lower your stress level. You can do this by using methods such as yoga or meditation. Talk with your doctor if you need help. General instructions  Watch your dizziness for any changes.  Take over-the-counter and prescription medicines only as told by your doctor. Talk with your doctor if you think that you are dizzy because of a medicine that you are taking.  Tell a friend or a family member that you are feeling  dizzy. If he or she notices any changes in your behavior, have this person call your doctor.  Keep all follow-up visits as told by your doctor. This is important. Contact a doctor if:  Your dizziness does not go away.  Your dizziness or light-headedness gets worse.  You feel sick to your stomach (nauseous).  You have trouble hearing.  You have new symptoms.  You are unsteady on your feet.  You feel like the room is spinning. Get help right away if:  You throw up (vomit) or have watery poop (diarrhea), and you cannot eat or drink anything.  You have trouble: ? Talking. ? Walking. ? Swallowing. ? Using your arms, hands, or legs.  You feel generally weak.  You are not thinking clearly, or you have trouble forming sentences. A friend or family member may notice this.  You have: ? Chest pain. ? Pain in your belly (abdomen). ? Shortness of breath. ? Sweating.  Your vision changes.  You are bleeding.  You have a very bad headache.  You have neck pain or a stiff neck.  You have a fever. These symptoms may be an emergency. Do not wait to see if the symptoms will go away. Get medical help right away. Call your local emergency services (911 in the [place] Do not drive yourself to the hospital. Summary  Dizziness makes you feel unsteady or light-headed. You  may feel like you are about to pass out (faint).  Drink enough fluid to keep your pee (urine) clear or pale yellow. Do not drink alcohol.  Avoid making quick movements if you feel dizzy.  Watch your dizziness for any changes. This information is not intended to replace advice given to you by your health care provider. Make sure you discuss any questions you have with your health care provider. Document Revised: 03/14/2017 Document Reviewed: 03/28/2016 Elsevier Patient Education  [place].   Confusion Confusion is the inability to think with the usual speed or clarity. People who are confused often  describe their thinking as cloudy or unclear. Confusion can also include feeling disoriented. This means you are unaware of where you are or who you are. You may also not know the date or time. When confused, you may have difficulty remembering, paying attention, or making decisions. Some people also act aggressively when they are confused. In some cases, confusion may come on quickly. In other cases, it may develop slowly over time. How quickly confusion comes on depends on the cause. Confusion may be caused by:  Head injury (concussion).  Seizures.  Stroke.  Fever.  Brain tumor.  Decrease in brain function due to a vascular or neurologic condition (dementia).  Emotions, like rage or terror.  Inability to know what is real and what is not (hallucinations).  Infections, such as a urinary tract infection (UTI).  Using too much alcohol, drugs, or medicines.  Loss of fluid (dehydration) or an imbalance of salts in the body (electrolytes).  Lack of sleep.  Low blood sugar (diabetes).  Low levels of oxygen. This comes from conditions such as chronic lung disorders.  Side effects of medicines, or taking medicines that affect other medicines (drug interactions).  Lack of certain nutrients, especially niacin, thiamine, vitamin C, or vitamin B.  Sudden drop in body temperature (hypothermia).  Change in routine, such as traveling or being hospitalized. Follow these instructions at home: Pay attention to your symptoms. Tell your health care provider about any changes or if you develop new symptoms. Follow these instructions to control or treat symptoms. Ask a family member or friend for help if needed. Medicines  Take over-the-counter and prescription medicines only as told by your health care provider.  Ask your health care provider about changing or stopping any medicines that may be causing your confusion.  Avoid pain medicines or sleep medicines until you have fully  recovered.  Use a pillbox or an alarm to help you take the right medicines at the right time. Lifestyle   Eat a balanced diet that includes fruits and vegetables.  Get enough sleep. For most adults, this is 7-9 hours each night.  Do not drink alcohol.  Do not become isolated. Spend time with other people and make plans for your days.  Do not drive until your health care provider says that it is safe to do so.  Do not use any products that contain nicotine or tobacco, such as cigarettes and e-cigarettes. If you need help quitting, ask your health care provider.  Stop other activities that may increase your chances of getting hurt. These may include some work duties, sports activities, swimming, or bike riding. Ask your health care provider what activities are safe for you. What caregivers can do  Find out if the person is confused. Ask the person to state his or her name, age, and the date. If the person is unsure or answers incorrectly, he or  she may be confused.  Always introduce yourself, no matter how well the person knows you.  Remind the person of his or her location. Do this often.  Place a calendar and clock near the person who is confused.  Talk about current events and plans for the day.  Keep the environment calm, quiet, and peaceful.  Help the person do the things that he or she is unable to do. These include: ? Taking medicines. ? Keeping follow-up visits with his or her health care provider. ? Helping with household duties, including meal preparation. ? Running errands.  Get help if you need it. There are several support groups for caregivers.  If the person you are helping needs more support, consider day care, extended care programs, or a skilled nursing facility. The person's health care provider may be able to help evaluate these options. General instructions  Monitor yourself for any conditions you may have. These may include: ? Checking your blood  glucose levels, if you have diabetes. ? Watching your weight, if you are overweight. ? Monitoring your blood pressure, if you have hypertension. ? Monitoring your body temperature, if you have a fever.  Keep all follow-up visits as told by your health care provider. This is important. Contact a health care provider if:  Your symptoms get worse. Get help right away if you:  Feel that you are not able to care for yourself.  Develop severe headaches, repeated vomiting, seizures, blackouts, or slurred speech.  Have increasing confusion, weakness, numbness, restlessness, or personality changes.  Develop a loss of balance, have marked dizziness, feel uncoordinated, or fall.  Develop severe anxiety, or you have delusions or hallucinations. These symptoms may represent a serious problem that is an emergency. Do not wait to see if the symptoms will go away. Get medical help right away. Call your local emergency services (911 in the [place] Do not drive yourself to the hospital. Summary  Confusion is the inability to think with the usual speed or clarity. People who are confused often describe their thinking as cloudy or unclear.  Confusion can also include having difficulty remembering, paying attention, or making decisions.  Confusion may come on quickly or develop slowly over time, depending on the cause. There are many different causes of confusion.  Ask for help from family members or friends if you are unable to take care of yourself. This information is not intended to replace advice given to you by your health care provider. Make sure you discuss any questions you have with your health care provider. Document Revised: 03/13/2017 Document Reviewed: 03/13/2017 Elsevier Patient Education  [place].   Transient Ischemic Attack A transient ischemic attack (TIA) is a "warning stroke" that causes stroke-like symptoms that then go away quickly. The symptoms of a TIA come on  suddenly, and they last less than 24 hours. Unlike a stroke, a TIA does not cause permanent damage to the brain. It is important to know the symptoms of a TIA and what to do. Seek medical care right away, even if your symptoms go away. Having a TIA is a sign that you are at higher risk for a permanent stroke. Lifestyle changes and medical treatments can help prevent a stroke. What are the causes? This condition is caused by a temporary blockage in an artery in the head or neck. The blockage does not allow the brain to get the blood supply it needs and can cause various symptoms. The blockage can be caused by:  Fatty buildup in an artery in the head or neck (atherosclerosis).  A blood clot.  Tearing of an artery (dissection).  Inflammation of an artery (vasculitis). Sometimes the cause is not known. What increases the risk? Certain factors may make you more likely to develop this condition. Some of these factors are things that you can change, such as:  Obesity.  Using products that contain nicotine or tobacco, such as cigarettes and e-cigarettes.  Taking oral birth control, especially if you also use tobacco.  Lack of physical activity.  Excessive use of alcohol.  Use of drugs, especially cocaine and methamphetamine. Other risk factors include:  High blood pressure (hypertension).  High cholesterol.  Diabetes mellitus.  Heart disease (coronary artery disease).  Atrial fibrillation.  Being over the age of 22.  Being male.  Family history of stroke.  Previous history of blood clots, stroke, TIA, or heart attack.  Sickle cell disease.  Being a woman with a history of preeclampsia.  Migraine headache.  Sleep apnea.  Chronic inflammatory diseases, such as rheumatoid arthritis or lupus.  Blood clotting disorders (hypercoagulable state). What are the signs or symptoms? Symptoms of this condition are the same as those of a stroke, but they are temporary. The  symptoms develop suddenly, and they go away quickly, usually within minutes to hours. Symptoms may include sudden:  Weakness or numbness in your face, arm, or leg, especially on one side of your body.  Trouble walking or difficulty moving your arms or legs.  Trouble speaking, understanding speech, or both (aphasia).  Vision changes in one or both eyes. These include double vision, blurred vision, or loss of vision.  Dizziness.  Confusion.  Loss of balance or coordination.  Nausea and vomiting.  Severe headache with no known cause. If possible, make note of the exact time that you last felt like your normal self and what time your symptoms started. Tell your health care provider. How is this diagnosed? This condition may be diagnosed based on:  Your symptoms and medical history.  A physical exam.  Imaging tests, usually a CT or MRI scan of the brain.  Blood tests. You may also have other tests, including:  Electrocardiogram (ECG).  Echocardiogram.  Carotid ultrasound.  A scan of the brain circulation (CT angiogram or MRI angiogram).  Continuous heart monitoring. How is this treated? The goal of treatment is to reduce the risk for a subsequent stroke. Treatment may include stroke prevention therapies such as:  Changes to diet or lifestyle to decrease your risk. Lifestyle changes may include exercising and stopping smoking.  Medicines to thin the blood (antiplatelets or anticoagulants).  Blood pressure medicines.  Medicines to reduce cholesterol.  Treating other health conditions, such as diabetes or atrial fibrillation. If testing shows that you have narrowing in the arteries to your brain, your health care provider may recommend a procedure, such as:  Carotid endarterectomy. This is a surgery to remove the blockage from your artery.  Carotid angioplasty and stenting. This is a procedure to open or widen an artery in the neck using a metal mesh tube (stent). The  stent helps keep the artery open by supporting the artery walls. Follow these instructions at home: Medicines  Take over-the-counter and prescription medicines only as told by your health care provider.  If you were told to take a medicine to thin your blood, such as aspirin or an anticoagulant, take it exactly as told by your health care provider. ? Taking too much blood-thinning medicine can  cause bleeding. ? If you do not take enough blood-thinning medicine, you will not have the protection that you need against a stroke and other problems. Eating and drinking   Eat 5 or more servings of fruits and vegetables each day.  Follow instructions from your health care provider about diet. You may need to follow a certain nutrition plan to help manage risk factors for stroke, such as high blood pressure, high cholesterol, diabetes, or obesity. This may include: ? Eating a low-fat, low-salt diet. ? Including a lot of fiber in your diet. ? Limiting the amount of carbohydrates and sugar in your diet.  Limit alcohol intake to no more than 1 drink a day for nonpregnant women and 2 drinks a day for men. One drink equals 12 oz of beer, 5 oz of wine, or 1 oz of hard liquor. General instructions  Maintain a healthy weight.  Stay physically active. Try to get at least 30 minutes of exercise on most or all days.  Find out if you have sleep apnea, and seek treatment if needed.  Do not use any products that contain nicotine or tobacco, such as cigarettes and e-cigarettes. If you need help quitting, ask your health care provider.  Do not abuse drugs.  Keep all follow-up visits as told by your health care provider. This is important. Where to find more information  American Stroke Association: www.strokeassociation.org  National Stroke Association: www.stroke.org Get help right away if:  You have chest pain or an irregular heartbeat.  You have any symptoms of stroke. The acronym BEFAST is an  easy way to remember the main warning signs of stroke. ? B - Balance problems. Signs include dizziness, sudden trouble walking, or loss of balance. ? E - Eye problems. This includes trouble seeing or a sudden change in vision. ? F - Face changes. This includes sudden weakness or numbness of the face, or the face or eyelid drooping to one side. ? A - Arm weakness or numbness. This happens suddenly and usually on one side of the body. ? S - Speech problems. This includes trouble speaking or trouble understanding speech. ? T - Time. Time to call 911 or seek emergency care. Do not wait to see if symptoms will go away. Make note of the time your symptoms started.  Other signs of stroke may include: ? A sudden, severe headache with no known cause. ? Nausea or vomiting. ? Seizure. These symptoms may represent a serious problem that is an emergency. Do not wait to see if the symptoms will go away. Get medical help right away. Call your local emergency services (911 in the [place] Do not drive yourself to the hospital. Summary  A TIA happens when an artery in the head or neck is blocked, leading to stroke-like symptoms that then go away quickly. The blockage clears before there is any permanent brain damage. A TIA is a medical emergency and requires immediate medical attention.  Symptoms of this condition are the same as those of a stroke, but they are temporary. The symptoms usually develop suddenly, and they go away quickly, usually within minutes to hours.  Having a TIA means that you are at high risk of a stroke in the near future.  Treatment may include medicines to thin the blood as well as medicines, diet changes, and lifestyle changes to manage conditions that increase the risk of another TIA or a stroke. This information is not intended to replace advice given to you by your  health care provider. Make sure you discuss any questions you have with your health care provider. Document Revised:  09/18/2018 Document Reviewed: 09/18/2018 Elsevier Patient Education  [place].   Vitamin B12 Deficiency Vitamin B12 deficiency means that your body does not have enough vitamin B12. The body needs this vitamin:  To make red blood cells.  To make genes (DNA).  To help the nerves work. If you do not have enough vitamin B12 in your body, you can have health problems. What are the causes?  Not eating enough foods that contain vitamin B12.  Not being able to absorb vitamin B12 from the food that you eat.  Certain digestive system diseases.  A condition in which the body does not make enough of a certain protein, which results in too few red blood cells (pernicious anemia).  Having a surgery in which part of the stomach or small intestine is removed.  Taking medicines that make it hard for the body to absorb vitamin B12. These medicines include: ? Heartburn medicines. ? Some antibiotic medicines. ? Other medicines that are used to treat certain conditions. What increases the risk?  Being older than age 37.  Eating a vegetarian or vegan diet, especially while you are pregnant.  Eating a poor diet while you are pregnant.  Taking certain medicines.  Having alcoholism. What are the signs or symptoms? In some cases, there are no symptoms. If the condition leads to too few blood cells or nerve damage, symptoms can occur, such as:  Feeling weak.  Feeling tired (fatigued).  Not being hungry.  Weight loss.  A loss of feeling (numbness) or tingling in your hands and feet.  Redness and burning of the tongue.  Being mixed up (confused) or having memory problems.  Sadness (depression).  Problems with your senses. This can include color blindness, ringing in the ears, or loss of taste.  Watery poop (diarrhea) or trouble pooping (constipation).  Trouble walking. If anemia is very bad, symptoms can include:  Being short of breath.  Being dizzy.  Having a very  fast heartbeat. How is this treated?  Changing the way you eat and drink, such as: ? Eating more foods that contain vitamin B12. ? Drinking little or no alcohol.  Getting vitamin B12 shots.  Taking vitamin B12 supplements. Your doctor will tell you the dose that is best for you. Follow these instructions at home: Eating and drinking   Eat lots of healthy foods that contain vitamin B12. These include: ? Meats and poultry, such as beef, pork, chicken, [place], and organ meats, such as liver. ? Seafood, such as clams, rainbow trout, salmon, tuna, and haddock. ? Eggs. ? Cereal and dairy products that have vitamin B12 added to them. Check the label. The items listed above may not be a complete list of what you can eat and drink. Contact a dietitian for more options. General instructions  Get any shots as told by your doctor.  Take supplements only as told by your doctor.  Do not drink alcohol if your doctor tells you not to. In some cases, you may only be asked to limit alcohol use.  Keep all follow-up visits as told by your doctor. This is important. Contact a doctor if:  Your symptoms come back. Get help right away if:  You have trouble breathing.  You have a very fast heartbeat.  You have chest pain.  You get dizzy.  You pass out. Summary  Vitamin B12 deficiency means that your  body is not getting enough vitamin B12.  In some cases, there are no symptoms of this condition.  Treatment may include making a change in the way you eat and drink, getting vitamin B12 shots, or taking supplements.  Eat lots of healthy foods that contain vitamin B12. This information is not intended to replace advice given to you by your health care provider. Make sure you discuss any questions you have with your health care provider. Document Revised: 11/18/2017 Document Reviewed: 11/18/2017 Elsevier Patient Education  2020 [place].    IMPORTANT INFORMATION: PAY CLOSE ATTENTION    PHYSICIAN DISCHARGE INSTRUCTIONS  Follow with Primary care provider  Redmond School, MD  and other consultants as instructed by your Hospitalist Physician  [facility] IF SYMPTOMS COME BACK, WORSEN OR NEW PROBLEM DEVELOPS   Please note: You were cared for by a hospitalist during your hospital stay. Every effort will be made to forward records to your primary care provider.  You can request that your primary care provider send for your hospital records if they have not received them.  Once you are discharged, your primary care physician will handle any further medical issues. Please note that NO REFILLS for any discharge medications will be authorized once you are discharged, as it is imperative that you return to your primary care physician (or establish a relationship with a primary care physician if you do not have one) for your post hospital discharge needs so that they can reassess your need for medications and monitor your lab values.  Please get a complete blood count and chemistry panel checked by your Primary MD at your next visit, and again as instructed by your Primary MD.  Get Medicines reviewed and adjusted: Please take all your medications with you for your next visit with your Primary MD  Laboratory/radiological data: Please request your Primary MD to go over all hospital tests and procedure/radiological results at the follow up, please ask your primary care provider to get all Hospital records sent to his/her office.  In some cases, they will be blood work, cultures and biopsy results pending at the time of your discharge. Please request that your primary care provider follow up on these results.  If you are diabetic, please bring your blood sugar readings with you to your follow up appointment with primary care.    Please call and make your follow up appointments as soon as possible.    Also Note the following: If you experience worsening  of your admission symptoms, develop shortness of breath, life threatening emergency, suicidal or homicidal thoughts you must seek medical attention immediately by calling 911 or calling your MD immediately  if symptoms less severe.  You must read complete instructions/literature along with all the possible adverse reactions/side effects for all the Medicines you take and that have been prescribed to you. Take any new Medicines after you have completely understood and accpet all the possible adverse reactions/side effects.   Do not drive when taking Pain medications or sleeping medications (Benzodiazepines)  Do not take more than prescribed Pain, Sleep and Anxiety Medications. It is not advisable to combine anxiety,sleep and pain medications without talking with your primary care practitioner  Special Instructions: If you have smoked or chewed Tobacco  in the last 2 yrs please stop smoking, stop any regular Alcohol  and or any Recreational drug use.  Wear Seat belts while driving.  Do not drive if taking any narcotic, mind altering or  controlled substances or recreational drugs or alcohol.

## 2019-06-14 NOTE — Progress Notes (Signed)
Pt is very agitated, restless, getting OOB,  pulling cardiac leads, removing IV lines, impulsive ,trying to hit staff member, danger to self from falling & causing serious injury ( pt is very unsteady). Already tried variety of less restrictive intervention without success.NP [place] informed. Meds ordered & given not effective. Security intervened multiple times. Non-violent restraint initiated as ordered. Spouse Vaughan Basta) informed. Will continue to assess & monitor pt accordingly.

## 2019-06-14 NOTE — TOC Initial Note (Signed)
Transition of Care [facility]) - Initial/Assessment Note    Patient Details  Name: [name] MRN: [id number] Date of Birth: 03-26-1940  [facility] of [facility]) CM/SW Contact:    Marilu Favre, RN Phone Number: 06/14/2019, 1:25 PM  Clinical Narrative:                 Spoke to patient and wife at bedside.   Provided Medicare.gov list of home health agencies. Wife would like [place]. Butch Penny with [facility] accepted referral for HHPT and SP.   Called Zack with [employer] for walker.   Expected Discharge Plan: [place] Barriers to Discharge: No Barriers Identified   Patient Goals and CMS Choice Patient states their goals for this hospitalization and ongoing recovery are:: to return to home CMS Medicare.gov Compare Post Acute Care list provided to:: Patient Choice offered to / list presented to : Patient, Spouse  Expected Discharge Plan and Services Expected Discharge Plan: [place] Choice: Durable Medical Equipment, Home Health Living arrangements for the past 2 months: Single Family Home Expected Discharge Date: 06/14/19               DME Arranged: Gilford Rile rolling DME Agency: [employer] Date DME Agency Contacted: 06/14/19 Time DME Agency Contacted: [id number] Representative spoke with at DME Agency: [place] Arranged: PT, [place] Agency: [place] ([place]) Date [place]: 06/14/19 Time [place]: 42 Representative spoke with at [place]: Butch Penny  Prior Living Arrangements/Services Living arrangements for the past 2 months: Single Family Home Lives with:: Spouse Patient language and need for interpreter reviewed:: Yes Do you feel safe going back to the place where you live?: Yes      Need for Family Participation in Patient Care: Yes (Comment) Care giver support system in place?: Yes (comment)   Criminal Activity/Legal Involvement Pertinent to Current  Situation/Hospitalization: No - Comment as needed  Activities of Daily Living Home Assistive Devices/Equipment: None ADL Screening (condition at time of admission) Patient's cognitive ability adequate to safely complete daily activities?: Yes Is the patient deaf or have difficulty hearing?: Yes Does the patient have difficulty seeing, even when wearing glasses/contacts?: No Does the patient have difficulty concentrating, remembering, or making decisions?: No Patient able to express need for assistance with ADLs?: Yes Does the patient have difficulty dressing or bathing?: No Independently performs ADLs?: Yes (appropriate for developmental age) Does the patient have difficulty walking or climbing stairs?: No Weakness of Legs: None Weakness of Arms/Hands: None  Permission Sought/Granted   Permission granted to share information with : Yes, Verbal Permission Granted  Share Information with NAME: Wife Vaughan Basta           Emotional Assessment Appearance:: Appears stated age            Admission diagnosis:  TIA (transient ischemic attack) [G45.9] Dizziness [R42] Confusion [R41.0] Acute CVA (cerebrovascular accident) [facility]) [I63.9] Patient Active Problem List   Diagnosis Date Noted  . Acute encephalopathy 06/14/2019  . Vascular dementia ([place]) 06/14/2019  . Cerebrovascular disease 06/14/2019  . Sundowning 06/14/2019  . Acute delirium 06/14/2019  . Vitamin B12 deficiency 06/12/2019  . History of hypertension   . Hypothyroidism   . PAD (peripheral artery disease) ([place])   . Carotid artery disease ([place])   . Syncope and collapse 08/13/2018  . Tobacco abuse 08/13/2018  . Internal Hemorrhoids with Occasional Rectal Bleeding 08/13/2018  . Peripheral vascular disease, unspecified (  [place]) 12/15/2013  . LGI bleed 08/07/2012  . HTN (hypertension)   . Status post arthroscopy of knee 11/05/2010  . Medial meniscus, posterior horn derangement 11/05/2010  . OA (osteoarthritis) of knee  11/05/2010   PCP:  Redmond School, MD Pharmacy:   [place], [place] - [address] [place] [address] Phone: [phone] Fax: [phone]  [id number] - [place], [id number] [place] [address] Phone: [phone] Fax: [phone]     Social Determinants of Health (SDOH) Interventions    Readmission Risk Interventions No flowsheet data found.
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
## 2019-06-14 NOTE — Progress Notes (Signed)
Pt now calm and cooperative, wife at bedside. No restraints were placed on this shift by this RN. Haldol given once with relief. Will continue to monitor.

## 2019-06-14 NOTE — Progress Notes (Signed)
  Echocardiogram 2D Echocardiogram has been performed.  [name] 06/14/2019, 12:14 PM
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
## 2019-06-14 NOTE — Progress Notes (Signed)
[name] to be D/C'd home per MD order.  Discussed prescriptions and follow up appointments with the patient and wife, [name]. Prescriptions were sent to patient's preferred pharmacy, medication list explained in detail to patient's wife, wife [name] verbalized understanding.  Allergies as of 06/14/2019      Reactions   Cardura [doxazosin]    Simvastatin    Reaction is unknown       Medication List    TAKE these medications   aspirin EC 81 MG tablet Take 81 mg by mouth daily.   cholecalciferol 25 MCG (1000 UNIT) tablet Commonly known as: VITAMIN D3 Take 1,000 Units by mouth daily.   Cyanocobalamin 2500 MCG Tabs Take 2,500 mcg by mouth daily. Start taking on: June 15, 2019   feeding supplement (ENSURE ENLIVE) Liqd Take 237 mLs by mouth 2 (two) times daily between meals.   levothyroxine 100 MCG tablet Commonly known as: SYNTHROID Take 100 mcg by mouth daily.   lisinopril 2.5 MG tablet Commonly known as: ZESTRIL Take 1 tablet (2.5 mg total) by mouth daily.   metoprolol succinate 25 MG 24 hr tablet Commonly known as: TOPROL-XL Take 1 tablet (25 mg total) by mouth daily.            Durable Medical Equipment  (From admission, onward)         Start     Ordered   06/14/19 1321  For home use only DME Walker rolling  Once    Question Answer Comment  Walker: With 5 Inch Wheels   Patient needs a walker to treat with the following condition Weakness      06/14/19 1321          Vitals:   06/14/19 0838 06/14/19 1212  BP: (!) 141/99 134/78  Pulse: 92 75  Resp: 18 16  Temp: (!) 97.4 F (36.3 C) 98.4 F (36.9 C)  SpO2: 98% 96%    Telemetry box discontinued. Pt denies pain at this time. No complaints noted. Walker delivered to room to take home. Home health set up.  An After Visit Summary was printed and given to the patient's wife, [name]. Patient escorted via [place], and D/C home via private auto with wife.  [place] 06/14/2019 3:25 PM

## 2019-06-14 NOTE — Plan of Care (Signed)
  Problem: Clinical Measurements: Goal: Respiratory complications will improve Outcome: Progressing Note: On room air   Problem: Activity: Goal: Risk for activity intolerance will decrease Outcome: Progressing Note: Up walking around in room with a standby assist   Problem: Nutrition: Goal: Adequate nutrition will be maintained Outcome: Progressing   Problem: Coping: Goal: Level of anxiety will decrease Outcome: Progressing   Problem: Elimination: Goal: Will not experience complications related to urinary retention Outcome: Progressing   Problem: Pain Managment: Goal: General experience of comfort will improve Outcome: Progressing Note: No complaints of pain this shift   Problem: Education: Goal: Knowledge of disease or condition will improve Outcome: Progressing Goal: Knowledge of secondary prevention will improve Outcome: Progressing Goal: Knowledge of patient specific risk factors addressed and post discharge goals established will improve Outcome: Progressing

## 2019-06-15 DIAGNOSIS — I10 Essential (primary) hypertension: Secondary | ICD-10-CM | POA: Diagnosis not present

## 2019-06-15 DIAGNOSIS — G9340 Encephalopathy, unspecified: Secondary | ICD-10-CM | POA: Diagnosis not present

## 2019-06-15 DIAGNOSIS — E538 Deficiency of other specified B group vitamins: Secondary | ICD-10-CM | POA: Diagnosis not present

## 2019-06-15 DIAGNOSIS — Z8673 Personal history of transient ischemic attack (TIA), and cerebral infarction without residual deficits: Secondary | ICD-10-CM | POA: Diagnosis not present

## 2019-06-15 DIAGNOSIS — Z79891 Long term (current) use of opiate analgesic: Secondary | ICD-10-CM | POA: Diagnosis not present

## 2019-06-15 DIAGNOSIS — Z9181 History of falling: Secondary | ICD-10-CM | POA: Diagnosis not present

## 2019-06-15 DIAGNOSIS — I6529 Occlusion and stenosis of unspecified carotid artery: Secondary | ICD-10-CM | POA: Diagnosis not present

## 2019-06-15 DIAGNOSIS — Z7901 Long term (current) use of anticoagulants: Secondary | ICD-10-CM | POA: Diagnosis not present

## 2019-06-15 DIAGNOSIS — E039 Hypothyroidism, unspecified: Secondary | ICD-10-CM | POA: Diagnosis not present

## 2019-06-15 DIAGNOSIS — I739 Peripheral vascular disease, unspecified: Secondary | ICD-10-CM | POA: Diagnosis not present

## 2019-06-16 DIAGNOSIS — Z7901 Long term (current) use of anticoagulants: Secondary | ICD-10-CM | POA: Diagnosis not present

## 2019-06-16 DIAGNOSIS — E538 Deficiency of other specified B group vitamins: Secondary | ICD-10-CM | POA: Diagnosis not present

## 2019-06-16 DIAGNOSIS — R55 Syncope and collapse: Secondary | ICD-10-CM | POA: Diagnosis not present

## 2019-06-16 DIAGNOSIS — G459 Transient cerebral ischemic attack, unspecified: Secondary | ICD-10-CM | POA: Diagnosis not present

## 2019-06-16 DIAGNOSIS — I6529 Occlusion and stenosis of unspecified carotid artery: Secondary | ICD-10-CM | POA: Diagnosis not present

## 2019-06-16 DIAGNOSIS — G9340 Encephalopathy, unspecified: Secondary | ICD-10-CM | POA: Diagnosis not present

## 2019-06-16 DIAGNOSIS — Z79891 Long term (current) use of opiate analgesic: Secondary | ICD-10-CM | POA: Diagnosis not present

## 2019-06-16 DIAGNOSIS — R269 Unspecified abnormalities of gait and mobility: Secondary | ICD-10-CM | POA: Diagnosis not present

## 2019-06-16 DIAGNOSIS — I739 Peripheral vascular disease, unspecified: Secondary | ICD-10-CM | POA: Diagnosis not present

## 2019-06-16 DIAGNOSIS — Z8673 Personal history of transient ischemic attack (TIA), and cerebral infarction without residual deficits: Secondary | ICD-10-CM | POA: Diagnosis not present

## 2019-06-16 DIAGNOSIS — I10 Essential (primary) hypertension: Secondary | ICD-10-CM | POA: Diagnosis not present

## 2019-06-16 DIAGNOSIS — Z1389 Encounter for screening for other disorder: Secondary | ICD-10-CM | POA: Diagnosis not present

## 2019-06-16 DIAGNOSIS — E039 Hypothyroidism, unspecified: Secondary | ICD-10-CM | POA: Diagnosis not present

## 2019-06-16 DIAGNOSIS — Z9181 History of falling: Secondary | ICD-10-CM | POA: Diagnosis not present

## 2019-06-17 DIAGNOSIS — Z7901 Long term (current) use of anticoagulants: Secondary | ICD-10-CM | POA: Diagnosis not present

## 2019-06-17 DIAGNOSIS — I739 Peripheral vascular disease, unspecified: Secondary | ICD-10-CM | POA: Diagnosis not present

## 2019-06-17 DIAGNOSIS — E039 Hypothyroidism, unspecified: Secondary | ICD-10-CM | POA: Diagnosis not present

## 2019-06-17 DIAGNOSIS — Z79891 Long term (current) use of opiate analgesic: Secondary | ICD-10-CM | POA: Diagnosis not present

## 2019-06-17 DIAGNOSIS — G9340 Encephalopathy, unspecified: Secondary | ICD-10-CM | POA: Diagnosis not present

## 2019-06-17 DIAGNOSIS — E538 Deficiency of other specified B group vitamins: Secondary | ICD-10-CM | POA: Diagnosis not present

## 2019-06-17 DIAGNOSIS — Z9181 History of falling: Secondary | ICD-10-CM | POA: Diagnosis not present

## 2019-06-17 DIAGNOSIS — I6529 Occlusion and stenosis of unspecified carotid artery: Secondary | ICD-10-CM | POA: Diagnosis not present

## 2019-06-17 DIAGNOSIS — I10 Essential (primary) hypertension: Secondary | ICD-10-CM | POA: Diagnosis not present

## 2019-06-17 DIAGNOSIS — Z8673 Personal history of transient ischemic attack (TIA), and cerebral infarction without residual deficits: Secondary | ICD-10-CM | POA: Diagnosis not present

## 2019-06-18 DIAGNOSIS — E538 Deficiency of other specified B group vitamins: Secondary | ICD-10-CM | POA: Diagnosis not present

## 2019-06-18 DIAGNOSIS — Z8673 Personal history of transient ischemic attack (TIA), and cerebral infarction without residual deficits: Secondary | ICD-10-CM | POA: Diagnosis not present

## 2019-06-18 DIAGNOSIS — Z7901 Long term (current) use of anticoagulants: Secondary | ICD-10-CM | POA: Diagnosis not present

## 2019-06-18 DIAGNOSIS — I10 Essential (primary) hypertension: Secondary | ICD-10-CM | POA: Diagnosis not present

## 2019-06-18 DIAGNOSIS — Z9181 History of falling: Secondary | ICD-10-CM | POA: Diagnosis not present

## 2019-06-18 DIAGNOSIS — I6529 Occlusion and stenosis of unspecified carotid artery: Secondary | ICD-10-CM | POA: Diagnosis not present

## 2019-06-18 DIAGNOSIS — E039 Hypothyroidism, unspecified: Secondary | ICD-10-CM | POA: Diagnosis not present

## 2019-06-18 DIAGNOSIS — Z79891 Long term (current) use of opiate analgesic: Secondary | ICD-10-CM | POA: Diagnosis not present

## 2019-06-18 DIAGNOSIS — G9340 Encephalopathy, unspecified: Secondary | ICD-10-CM | POA: Diagnosis not present

## 2019-06-18 DIAGNOSIS — I739 Peripheral vascular disease, unspecified: Secondary | ICD-10-CM | POA: Diagnosis not present

## 2019-06-21 DIAGNOSIS — Z7901 Long term (current) use of anticoagulants: Secondary | ICD-10-CM | POA: Diagnosis not present

## 2019-06-21 DIAGNOSIS — R296 Repeated falls: Secondary | ICD-10-CM | POA: Diagnosis not present

## 2019-06-21 DIAGNOSIS — I739 Peripheral vascular disease, unspecified: Secondary | ICD-10-CM | POA: Diagnosis not present

## 2019-06-21 DIAGNOSIS — Z79891 Long term (current) use of opiate analgesic: Secondary | ICD-10-CM | POA: Diagnosis not present

## 2019-06-21 DIAGNOSIS — G459 Transient cerebral ischemic attack, unspecified: Secondary | ICD-10-CM | POA: Diagnosis not present

## 2019-06-21 DIAGNOSIS — R55 Syncope and collapse: Secondary | ICD-10-CM | POA: Diagnosis not present

## 2019-06-21 DIAGNOSIS — I10 Essential (primary) hypertension: Secondary | ICD-10-CM | POA: Diagnosis not present

## 2019-06-21 DIAGNOSIS — I6529 Occlusion and stenosis of unspecified carotid artery: Secondary | ICD-10-CM | POA: Diagnosis not present

## 2019-06-21 DIAGNOSIS — Z8673 Personal history of transient ischemic attack (TIA), and cerebral infarction without residual deficits: Secondary | ICD-10-CM | POA: Diagnosis not present

## 2019-06-21 DIAGNOSIS — E538 Deficiency of other specified B group vitamins: Secondary | ICD-10-CM | POA: Diagnosis not present

## 2019-06-21 DIAGNOSIS — G9340 Encephalopathy, unspecified: Secondary | ICD-10-CM | POA: Diagnosis not present

## 2019-06-21 DIAGNOSIS — Z9181 History of falling: Secondary | ICD-10-CM | POA: Diagnosis not present

## 2019-06-21 DIAGNOSIS — E039 Hypothyroidism, unspecified: Secondary | ICD-10-CM | POA: Diagnosis not present

## 2019-06-23 DIAGNOSIS — G9340 Encephalopathy, unspecified: Secondary | ICD-10-CM | POA: Diagnosis not present

## 2019-06-23 DIAGNOSIS — Z7901 Long term (current) use of anticoagulants: Secondary | ICD-10-CM | POA: Diagnosis not present

## 2019-06-23 DIAGNOSIS — I739 Peripheral vascular disease, unspecified: Secondary | ICD-10-CM | POA: Diagnosis not present

## 2019-06-23 DIAGNOSIS — E538 Deficiency of other specified B group vitamins: Secondary | ICD-10-CM | POA: Diagnosis not present

## 2019-06-23 DIAGNOSIS — E039 Hypothyroidism, unspecified: Secondary | ICD-10-CM | POA: Diagnosis not present

## 2019-06-23 DIAGNOSIS — Z8673 Personal history of transient ischemic attack (TIA), and cerebral infarction without residual deficits: Secondary | ICD-10-CM | POA: Diagnosis not present

## 2019-06-23 DIAGNOSIS — I10 Essential (primary) hypertension: Secondary | ICD-10-CM | POA: Diagnosis not present

## 2019-06-23 DIAGNOSIS — I6529 Occlusion and stenosis of unspecified carotid artery: Secondary | ICD-10-CM | POA: Diagnosis not present

## 2019-06-23 DIAGNOSIS — I4891 Unspecified atrial fibrillation: Secondary | ICD-10-CM | POA: Diagnosis not present

## 2019-06-23 DIAGNOSIS — Z79891 Long term (current) use of opiate analgesic: Secondary | ICD-10-CM | POA: Diagnosis not present

## 2019-06-23 DIAGNOSIS — Z9181 History of falling: Secondary | ICD-10-CM | POA: Diagnosis not present

## 2019-06-23 DIAGNOSIS — E7849 Other hyperlipidemia: Secondary | ICD-10-CM | POA: Diagnosis not present

## 2019-06-25 DIAGNOSIS — I10 Essential (primary) hypertension: Secondary | ICD-10-CM | POA: Diagnosis not present

## 2019-06-25 DIAGNOSIS — Z79891 Long term (current) use of opiate analgesic: Secondary | ICD-10-CM | POA: Diagnosis not present

## 2019-06-25 DIAGNOSIS — Z8673 Personal history of transient ischemic attack (TIA), and cerebral infarction without residual deficits: Secondary | ICD-10-CM | POA: Diagnosis not present

## 2019-06-25 DIAGNOSIS — Z7901 Long term (current) use of anticoagulants: Secondary | ICD-10-CM | POA: Diagnosis not present

## 2019-06-25 DIAGNOSIS — Z9181 History of falling: Secondary | ICD-10-CM | POA: Diagnosis not present

## 2019-06-25 DIAGNOSIS — I739 Peripheral vascular disease, unspecified: Secondary | ICD-10-CM | POA: Diagnosis not present

## 2019-06-25 DIAGNOSIS — I6529 Occlusion and stenosis of unspecified carotid artery: Secondary | ICD-10-CM | POA: Diagnosis not present

## 2019-06-25 DIAGNOSIS — E538 Deficiency of other specified B group vitamins: Secondary | ICD-10-CM | POA: Diagnosis not present

## 2019-06-25 DIAGNOSIS — G9340 Encephalopathy, unspecified: Secondary | ICD-10-CM | POA: Diagnosis not present

## 2019-06-25 DIAGNOSIS — E039 Hypothyroidism, unspecified: Secondary | ICD-10-CM | POA: Diagnosis not present

## 2019-06-29 DIAGNOSIS — Z8673 Personal history of transient ischemic attack (TIA), and cerebral infarction without residual deficits: Secondary | ICD-10-CM | POA: Diagnosis not present

## 2019-06-29 DIAGNOSIS — G9340 Encephalopathy, unspecified: Secondary | ICD-10-CM | POA: Diagnosis not present

## 2019-06-29 DIAGNOSIS — Z7901 Long term (current) use of anticoagulants: Secondary | ICD-10-CM | POA: Diagnosis not present

## 2019-06-29 DIAGNOSIS — Z79891 Long term (current) use of opiate analgesic: Secondary | ICD-10-CM | POA: Diagnosis not present

## 2019-06-29 DIAGNOSIS — E538 Deficiency of other specified B group vitamins: Secondary | ICD-10-CM | POA: Diagnosis not present

## 2019-06-29 DIAGNOSIS — Z9181 History of falling: Secondary | ICD-10-CM | POA: Diagnosis not present

## 2019-06-29 DIAGNOSIS — E039 Hypothyroidism, unspecified: Secondary | ICD-10-CM | POA: Diagnosis not present

## 2019-06-29 DIAGNOSIS — I10 Essential (primary) hypertension: Secondary | ICD-10-CM | POA: Diagnosis not present

## 2019-06-29 DIAGNOSIS — I739 Peripheral vascular disease, unspecified: Secondary | ICD-10-CM | POA: Diagnosis not present

## 2019-06-29 DIAGNOSIS — I6529 Occlusion and stenosis of unspecified carotid artery: Secondary | ICD-10-CM | POA: Diagnosis not present

## 2019-07-01 DIAGNOSIS — E538 Deficiency of other specified B group vitamins: Secondary | ICD-10-CM | POA: Diagnosis not present

## 2019-07-01 DIAGNOSIS — G9340 Encephalopathy, unspecified: Secondary | ICD-10-CM | POA: Diagnosis not present

## 2019-07-01 DIAGNOSIS — E039 Hypothyroidism, unspecified: Secondary | ICD-10-CM | POA: Diagnosis not present

## 2019-07-01 DIAGNOSIS — Z7901 Long term (current) use of anticoagulants: Secondary | ICD-10-CM | POA: Diagnosis not present

## 2019-07-01 DIAGNOSIS — I10 Essential (primary) hypertension: Secondary | ICD-10-CM | POA: Diagnosis not present

## 2019-07-01 DIAGNOSIS — Z8673 Personal history of transient ischemic attack (TIA), and cerebral infarction without residual deficits: Secondary | ICD-10-CM | POA: Diagnosis not present

## 2019-07-01 DIAGNOSIS — I739 Peripheral vascular disease, unspecified: Secondary | ICD-10-CM | POA: Diagnosis not present

## 2019-07-01 DIAGNOSIS — I6529 Occlusion and stenosis of unspecified carotid artery: Secondary | ICD-10-CM | POA: Diagnosis not present

## 2019-07-01 DIAGNOSIS — Z9181 History of falling: Secondary | ICD-10-CM | POA: Diagnosis not present

## 2019-07-01 DIAGNOSIS — Z79891 Long term (current) use of opiate analgesic: Secondary | ICD-10-CM | POA: Diagnosis not present

## 2019-07-05 DIAGNOSIS — I739 Peripheral vascular disease, unspecified: Secondary | ICD-10-CM | POA: Diagnosis not present

## 2019-07-05 DIAGNOSIS — E538 Deficiency of other specified B group vitamins: Secondary | ICD-10-CM | POA: Diagnosis not present

## 2019-07-05 DIAGNOSIS — E039 Hypothyroidism, unspecified: Secondary | ICD-10-CM | POA: Diagnosis not present

## 2019-07-06 DIAGNOSIS — Z8673 Personal history of transient ischemic attack (TIA), and cerebral infarction without residual deficits: Secondary | ICD-10-CM | POA: Diagnosis not present

## 2019-07-06 DIAGNOSIS — Z7901 Long term (current) use of anticoagulants: Secondary | ICD-10-CM | POA: Diagnosis not present

## 2019-07-06 DIAGNOSIS — G9340 Encephalopathy, unspecified: Secondary | ICD-10-CM | POA: Diagnosis not present

## 2019-07-06 DIAGNOSIS — E039 Hypothyroidism, unspecified: Secondary | ICD-10-CM | POA: Diagnosis not present

## 2019-07-06 DIAGNOSIS — I739 Peripheral vascular disease, unspecified: Secondary | ICD-10-CM | POA: Diagnosis not present

## 2019-07-06 DIAGNOSIS — Z9181 History of falling: Secondary | ICD-10-CM | POA: Diagnosis not present

## 2019-07-06 DIAGNOSIS — I6529 Occlusion and stenosis of unspecified carotid artery: Secondary | ICD-10-CM | POA: Diagnosis not present

## 2019-07-06 DIAGNOSIS — E538 Deficiency of other specified B group vitamins: Secondary | ICD-10-CM | POA: Diagnosis not present

## 2019-07-06 DIAGNOSIS — I10 Essential (primary) hypertension: Secondary | ICD-10-CM | POA: Diagnosis not present

## 2019-07-06 DIAGNOSIS — Z79891 Long term (current) use of opiate analgesic: Secondary | ICD-10-CM | POA: Diagnosis not present

## 2019-07-07 ENCOUNTER — Other Ambulatory Visit: Payer: Self-pay

## 2019-07-07 ENCOUNTER — Ambulatory Visit (HOSPITAL_COMMUNITY)
Admission: RE | Admit: 2019-07-07 | Discharge: 2019-07-07 | Disposition: A | Discharge status: Home / Self Care | Payer: Medicare Other | Source: Ambulatory Visit | Attending: Neurology | Admitting: Neurology

## 2019-07-07 DIAGNOSIS — R569 Unspecified convulsions: Secondary | ICD-10-CM | POA: Insufficient documentation

## 2019-07-07 DIAGNOSIS — Z79899 Other long term (current) drug therapy: Secondary | ICD-10-CM | POA: Diagnosis not present

## 2019-07-07 DIAGNOSIS — Z7982 Long term (current) use of aspirin: Secondary | ICD-10-CM | POA: Diagnosis not present

## 2019-07-07 DIAGNOSIS — R55 Syncope and collapse: Secondary | ICD-10-CM | POA: Diagnosis not present

## 2019-07-07 NOTE — Progress Notes (Signed)
EEG complete - results pending 

## 2019-07-07 NOTE — Procedures (Signed)
  Hiram A. Merlene Laughter, MD     [email or web address]           HISTORY: The patient is a 79 year old who presents with recurrent syncope worrisome for seizures.  MEDICATIONS:  Current Outpatient Medications:  .  aspirin EC 81 MG tablet, Take 81 mg by mouth daily., Disp: , Rfl:  .  cholecalciferol (VITAMIN D3) 25 MCG (1000 UT) tablet, Take 1,000 Units by mouth daily., Disp: , Rfl:  .  Cyanocobalamin (VITAMIN B-12) 2500 MCG TABS, Take 2,500 mcg by mouth daily., Disp: 30 tablet, Rfl: 1 .  feeding supplement, ENSURE ENLIVE, (ENSURE ENLIVE) LIQD, Take 237 mLs by mouth 2 (two) times daily between meals., Disp: 237 mL, Rfl: 12 .  levothyroxine (SYNTHROID) 100 MCG tablet, Take 100 mcg by mouth daily., Disp: , Rfl:  .  lisinopril (ZESTRIL) 2.5 MG tablet, Take 1 tablet (2.5 mg total) by mouth daily., Disp: 30 tablet, Rfl: 0 .  metoprolol succinate (TOPROL-XL) 25 MG 24 hr tablet, Take 1 tablet (25 mg total) by mouth daily., Disp: 30 tablet, Rfl: 0     ANALYSIS: A 16 channel recording using standard 10 20 measurements is conducted for 30 minutes.  There is a well-formed posterior dominant rhythm of  8.5 hertz which attenuates with eye-opening. There is beta activity observed in the frontal areas. Awake and drowsy architecture of observed. Photic stimulation is carried out without abnormal changes in the background activity. There is no focal or lateralized slowing. There is no epileptiform activities noted.   IMPRESSION: 1. This is a normal recording of the awake and drowsy states.      Britne Borelli A. Merlene Laughter, M.D.  Diplomate, Tax adviser of Psychiatry and Neurology ( Neurology).

## 2019-07-08 DIAGNOSIS — Z8673 Personal history of transient ischemic attack (TIA), and cerebral infarction without residual deficits: Secondary | ICD-10-CM | POA: Diagnosis not present

## 2019-07-08 DIAGNOSIS — Z79891 Long term (current) use of opiate analgesic: Secondary | ICD-10-CM | POA: Diagnosis not present

## 2019-07-08 DIAGNOSIS — Z9181 History of falling: Secondary | ICD-10-CM | POA: Diagnosis not present

## 2019-07-08 DIAGNOSIS — E538 Deficiency of other specified B group vitamins: Secondary | ICD-10-CM | POA: Diagnosis not present

## 2019-07-08 DIAGNOSIS — I739 Peripheral vascular disease, unspecified: Secondary | ICD-10-CM | POA: Diagnosis not present

## 2019-07-08 DIAGNOSIS — I10 Essential (primary) hypertension: Secondary | ICD-10-CM | POA: Diagnosis not present

## 2019-07-08 DIAGNOSIS — G9340 Encephalopathy, unspecified: Secondary | ICD-10-CM | POA: Diagnosis not present

## 2019-07-08 DIAGNOSIS — E039 Hypothyroidism, unspecified: Secondary | ICD-10-CM | POA: Diagnosis not present

## 2019-07-08 DIAGNOSIS — I6529 Occlusion and stenosis of unspecified carotid artery: Secondary | ICD-10-CM | POA: Diagnosis not present

## 2019-07-08 DIAGNOSIS — Z7901 Long term (current) use of anticoagulants: Secondary | ICD-10-CM | POA: Diagnosis not present

## 2019-07-23 DIAGNOSIS — E7849 Other hyperlipidemia: Secondary | ICD-10-CM | POA: Diagnosis not present

## 2019-07-23 DIAGNOSIS — I129 Hypertensive chronic kidney disease with stage 1 through stage 4 chronic kidney disease, or unspecified chronic kidney disease: Secondary | ICD-10-CM | POA: Diagnosis not present

## 2019-07-23 DIAGNOSIS — N182 Chronic kidney disease, stage 2 (mild): Secondary | ICD-10-CM | POA: Diagnosis not present

## 2019-07-23 DIAGNOSIS — E039 Hypothyroidism, unspecified: Secondary | ICD-10-CM | POA: Diagnosis not present

## 2019-08-04 DIAGNOSIS — R7989 Other specified abnormal findings of blood chemistry: Secondary | ICD-10-CM | POA: Diagnosis not present

## 2019-08-11 ENCOUNTER — Other Ambulatory Visit: Payer: Self-pay | Admitting: *Deleted

## 2019-08-11 DIAGNOSIS — I739 Peripheral vascular disease, unspecified: Secondary | ICD-10-CM

## 2019-08-18 ENCOUNTER — Ambulatory Visit (HOSPITAL_COMMUNITY)
Admission: RE | Admit: 2019-08-18 | Discharge: 2019-08-18 | Disposition: A | Discharge status: Home / Self Care | Payer: Medicare Other | Source: Ambulatory Visit | Attending: Vascular Surgery | Admitting: Vascular Surgery

## 2019-08-18 ENCOUNTER — Other Ambulatory Visit: Payer: Self-pay

## 2019-08-18 ENCOUNTER — Ambulatory Visit: Payer: Medicare Other | Admitting: Vascular Surgery

## 2019-08-18 ENCOUNTER — Encounter: Payer: Self-pay | Admitting: Vascular Surgery

## 2019-08-18 VITALS — BP 123/75 | HR 60 | Temp 98.0°F | Resp 20 | Ht 71.0 in | Wt 194.0 lb

## 2019-08-18 DIAGNOSIS — I739 Peripheral vascular disease, unspecified: Secondary | ICD-10-CM

## 2019-08-18 DIAGNOSIS — R296 Repeated falls: Secondary | ICD-10-CM | POA: Diagnosis not present

## 2019-08-18 DIAGNOSIS — R55 Syncope and collapse: Secondary | ICD-10-CM | POA: Diagnosis not present

## 2019-08-18 DIAGNOSIS — E538 Deficiency of other specified B group vitamins: Secondary | ICD-10-CM | POA: Diagnosis not present
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
## 2019-08-23 DIAGNOSIS — E7849 Other hyperlipidemia: Secondary | ICD-10-CM | POA: Diagnosis not present

## 2019-08-23 DIAGNOSIS — I4891 Unspecified atrial fibrillation: Secondary | ICD-10-CM | POA: Diagnosis not present

## 2019-08-25 DIAGNOSIS — Z1389 Encounter for screening for other disorder: Secondary | ICD-10-CM | POA: Diagnosis not present

## 2019-08-25 DIAGNOSIS — Z0000 Encounter for general adult medical examination without abnormal findings: Secondary | ICD-10-CM | POA: Diagnosis not present

## 2019-09-03 DIAGNOSIS — E538 Deficiency of other specified B group vitamins: Secondary | ICD-10-CM | POA: Diagnosis not present

## 2019-09-21 ENCOUNTER — Telehealth: Payer: Self-pay | Admitting: Cardiology

## 2019-09-21 NOTE — Telephone Encounter (Signed)
°  Patient Consent for Virtual Visit         JOSEPHMICHAEL Hubbard has provided verbal consent on 09/21/2019 for a virtual visit (video or telephone).   CONSENT FOR VIRTUAL VISIT FOR:  [name]  By participating in this virtual visit I agree to the following:  I hereby voluntarily request, consent and authorize [facility] and its employed or contracted physicians, physician assistants, nurse practitioners or other licensed health care professionals (the Practitioner), to provide me with telemedicine health care services (the Services") as deemed necessary by the treating Practitioner. I acknowledge and consent to receive the Services by the Practitioner via telemedicine. I understand that the telemedicine visit will involve communicating with the Practitioner through live audiovisual communication technology and the disclosure of certain medical information by electronic transmission. I acknowledge that I have been given the opportunity to request an in-person assessment or other available alternative prior to the telemedicine visit and am voluntarily participating in the telemedicine visit.  I understand that I have the right to withhold or withdraw my consent to the use of telemedicine in the course of my care at any time, without affecting my right to future care or treatment, and that the Practitioner or I may terminate the telemedicine visit at any time. I understand that I have the right to inspect all information obtained and/or recorded in the course of the telemedicine visit and may receive copies of available information for a reasonable fee.  I understand that some of the potential risks of receiving the Services via telemedicine include:   Delay or interruption in medical evaluation due to technological equipment failure or disruption;  Information transmitted may not be sufficient (e.g. poor resolution of images) to allow for appropriate medical decision making by the  Practitioner; and/or   In rare instances, security protocols could fail, causing a breach of personal health information.  Furthermore, I acknowledge that it is my responsibility to provide information about my medical history, conditions and care that is complete and accurate to the best of my ability. I acknowledge that Practitioner's advice, recommendations, and/or decision may be based on factors not within their control, such as incomplete or inaccurate data provided by me or distortions of diagnostic images or specimens that may result from electronic transmissions. I understand that the practice of medicine is not an exact science and that Practitioner makes no warranties or guarantees regarding treatment outcomes. I acknowledge that a copy of this consent can be made available to me via my patient portal ([facility]), or I can request a printed copy by calling the office of [facility].    I understand that my insurance will be billed for this visit.   I have read or had this consent read to me.  I understand the contents of this consent, which adequately explains the benefits and risks of the Services being provided via telemedicine.   I have been provided ample opportunity to ask questions regarding this consent and the Services and have had my questions answered to my satisfaction.  I give my informed consent for the services to be provided through the use of telemedicine in my medical care

## 2019-09-22 ENCOUNTER — Telehealth (INDEPENDENT_AMBULATORY_CARE_PROVIDER_SITE_OTHER): Payer: Medicare Other | Admitting: Cardiology

## 2019-09-22 ENCOUNTER — Encounter: Payer: Self-pay | Admitting: Cardiology

## 2019-09-22 VITALS — BP 125/62 | HR 63 | Ht 71.0 in | Wt 193.0 lb

## 2019-09-22 DIAGNOSIS — I739 Peripheral vascular disease, unspecified: Secondary | ICD-10-CM

## 2019-09-22 DIAGNOSIS — Z87898 Personal history of other specified conditions: Secondary | ICD-10-CM | POA: Diagnosis not present

## 2019-09-22 DIAGNOSIS — I129 Hypertensive chronic kidney disease with stage 1 through stage 4 chronic kidney disease, or unspecified chronic kidney disease: Secondary | ICD-10-CM | POA: Diagnosis not present

## 2019-09-22 DIAGNOSIS — Z8673 Personal history of transient ischemic attack (TIA), and cerebral infarction without residual deficits: Secondary | ICD-10-CM | POA: Diagnosis not present

## 2019-09-22 DIAGNOSIS — N183 Chronic kidney disease, stage 3 unspecified: Secondary | ICD-10-CM | POA: Diagnosis not present

## 2019-09-22 DIAGNOSIS — Z79899 Other long term (current) drug therapy: Secondary | ICD-10-CM | POA: Diagnosis not present

## 2019-09-22 DIAGNOSIS — E039 Hypothyroidism, unspecified: Secondary | ICD-10-CM | POA: Diagnosis not present

## 2019-09-22 DIAGNOSIS — E782 Mixed hyperlipidemia: Secondary | ICD-10-CM | POA: Diagnosis not present

## 2019-09-22 MED ORDER — ROSUVASTATIN CALCIUM 5 MG PO TABS: 5.0000 mg | ORAL_TABLET | Freq: Every day | ORAL | 3 refills | Status: DC

## 2019-09-22 NOTE — Patient Instructions (Addendum)
Medication Instructions:    Your physician has recommended you make the following change in your medication:   Start crestor (rosuvastatin) 5 mg by mouth daily at bedtime  Continue other medications the same  Labwork: Your physician recommends that you return for a FASTING lipid/liver profile: in 8 weeks. Please do not eat or drink for at least 8 hours when you have this done. You may take your medications that morning with a sip of water.  This may be done at  [facility] or [facility] ([place]) Monday-Friday from 8:00 am - 4:00 pm. No appointment is needed.  Testing/Procedures:  NONE  Follow-Up:  Your physician recommends that you schedule a follow-up appointment in: 6 months (office). You will receive a reminder letter in the mail in about 4 months reminding you to call and schedule your appointment. If you don't receive this letter, please contact our office.  Any Other Special Instructions Will Be Listed Below (If Applicable).   Call our office if you are unable to tolerate your crestor  If you need a refill on your cardiac medications before your next appointment, please call your pharmacy.
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
## 2019-10-05 DIAGNOSIS — E538 Deficiency of other specified B group vitamins: Secondary | ICD-10-CM | POA: Diagnosis not present

## 2019-10-22 DIAGNOSIS — I739 Peripheral vascular disease, unspecified: Secondary | ICD-10-CM | POA: Diagnosis not present

## 2019-10-22 DIAGNOSIS — I129 Hypertensive chronic kidney disease with stage 1 through stage 4 chronic kidney disease, or unspecified chronic kidney disease: Secondary | ICD-10-CM | POA: Diagnosis not present

## 2019-10-22 DIAGNOSIS — N182 Chronic kidney disease, stage 2 (mild): Secondary | ICD-10-CM | POA: Diagnosis not present

## 2019-10-22 DIAGNOSIS — Z8673 Personal history of transient ischemic attack (TIA), and cerebral infarction without residual deficits: Secondary | ICD-10-CM | POA: Diagnosis not present

## 2019-11-04 DIAGNOSIS — E538 Deficiency of other specified B group vitamins: Secondary | ICD-10-CM | POA: Diagnosis not present

## 2019-11-08 DIAGNOSIS — Z79899 Other long term (current) drug therapy: Secondary | ICD-10-CM | POA: Diagnosis not present

## 2019-11-08 DIAGNOSIS — E782 Mixed hyperlipidemia: Secondary | ICD-10-CM | POA: Diagnosis not present

## 2019-11-08 LAB — HEPATIC FUNCTION PANEL
AG Ratio: 1.4 (calc) (ref 1.0–2.5)
ALT: 8 U/L — ABNORMAL LOW (ref 9–46)
AST: 12 U/L (ref 10–35)
Albumin: 3.9 g/dL (ref 3.6–5.1)
Alkaline phosphatase (APISO): 32 U/L — ABNORMAL LOW (ref 35–144)
Bilirubin, Direct: 0.2 mg/dL (ref 0.0–0.2)
Globulin: 2.8 g/dL (calc) (ref 1.9–3.7)
Indirect Bilirubin: 0.5 mg/dL (calc) (ref 0.2–1.2)
Total Bilirubin: 0.7 mg/dL (ref 0.2–1.2)
Total Protein: 6.7 g/dL (ref 6.1–8.1)

## 2019-12-03 DIAGNOSIS — E538 Deficiency of other specified B group vitamins: Secondary | ICD-10-CM | POA: Diagnosis not present

## 2019-12-23 DIAGNOSIS — I4891 Unspecified atrial fibrillation: Secondary | ICD-10-CM | POA: Diagnosis not present

## 2019-12-23 DIAGNOSIS — E7849 Other hyperlipidemia: Secondary | ICD-10-CM | POA: Diagnosis not present

## 2020-01-04 DIAGNOSIS — Z23 Encounter for immunization: Secondary | ICD-10-CM | POA: Diagnosis not present

## 2020-01-04 DIAGNOSIS — D510 Vitamin B12 deficiency anemia due to intrinsic factor deficiency: Secondary | ICD-10-CM | POA: Diagnosis not present

## 2020-01-22 DIAGNOSIS — I4891 Unspecified atrial fibrillation: Secondary | ICD-10-CM | POA: Diagnosis not present

## 2020-01-22 DIAGNOSIS — E7849 Other hyperlipidemia: Secondary | ICD-10-CM | POA: Diagnosis not present

## 2020-02-03 DIAGNOSIS — E538 Deficiency of other specified B group vitamins: Secondary | ICD-10-CM | POA: Diagnosis not present

## 2020-03-03 DIAGNOSIS — D519 Vitamin B12 deficiency anemia, unspecified: Secondary | ICD-10-CM | POA: Diagnosis not present

## 2020-03-28 DIAGNOSIS — L03119 Cellulitis of unspecified part of limb: Secondary | ICD-10-CM | POA: Diagnosis not present

## 2020-03-29 DIAGNOSIS — L03119 Cellulitis of unspecified part of limb: Secondary | ICD-10-CM | POA: Diagnosis not present

## 2020-03-30 DIAGNOSIS — L03119 Cellulitis of unspecified part of limb: Secondary | ICD-10-CM | POA: Diagnosis not present

## 2020-04-03 DIAGNOSIS — D510 Vitamin B12 deficiency anemia due to intrinsic factor deficiency: Secondary | ICD-10-CM | POA: Diagnosis not present

## 2020-04-22 DIAGNOSIS — E7849 Other hyperlipidemia: Secondary | ICD-10-CM | POA: Diagnosis not present

## 2020-04-22 DIAGNOSIS — I4891 Unspecified atrial fibrillation: Secondary | ICD-10-CM | POA: Diagnosis not present

## 2020-05-04 DIAGNOSIS — E538 Deficiency of other specified B group vitamins: Secondary | ICD-10-CM | POA: Diagnosis not present

## 2020-06-01 DIAGNOSIS — E538 Deficiency of other specified B group vitamins: Secondary | ICD-10-CM | POA: Diagnosis not present

## 2020-06-21 DIAGNOSIS — I4891 Unspecified atrial fibrillation: Secondary | ICD-10-CM | POA: Diagnosis not present

## 2020-06-21 DIAGNOSIS — E7849 Other hyperlipidemia: Secondary | ICD-10-CM | POA: Diagnosis not present

## 2020-07-03 DIAGNOSIS — E538 Deficiency of other specified B group vitamins: Secondary | ICD-10-CM | POA: Diagnosis not present

## 2020-07-22 DIAGNOSIS — I4891 Unspecified atrial fibrillation: Secondary | ICD-10-CM | POA: Diagnosis not present

## 2020-07-22 DIAGNOSIS — E7849 Other hyperlipidemia: Secondary | ICD-10-CM | POA: Diagnosis not present

## 2020-08-01 ENCOUNTER — Other Ambulatory Visit: Payer: Self-pay

## 2020-08-01 DIAGNOSIS — I779 Disorder of arteries and arterioles, unspecified: Secondary | ICD-10-CM

## 2020-08-01 DIAGNOSIS — I739 Peripheral vascular disease, unspecified: Secondary | ICD-10-CM

## 2020-08-02 DIAGNOSIS — D510 Vitamin B12 deficiency anemia due to intrinsic factor deficiency: Secondary | ICD-10-CM | POA: Diagnosis not present

## 2020-08-23 ENCOUNTER — Encounter: Payer: Self-pay | Admitting: Vascular Surgery

## 2020-08-23 ENCOUNTER — Ambulatory Visit (INDEPENDENT_AMBULATORY_CARE_PROVIDER_SITE_OTHER)
Admission: RE | Admit: 2020-08-23 | Discharge: 2020-08-23 | Disposition: A | Discharge status: Home / Self Care | Payer: Medicare Other | Source: Ambulatory Visit | Attending: Vascular Surgery | Admitting: Vascular Surgery

## 2020-08-23 ENCOUNTER — Other Ambulatory Visit: Payer: Self-pay

## 2020-08-23 ENCOUNTER — Ambulatory Visit: Payer: Medicare Other | Admitting: Vascular Surgery

## 2020-08-23 ENCOUNTER — Ambulatory Visit (HOSPITAL_COMMUNITY)
Admission: RE | Admit: 2020-08-23 | Discharge: 2020-08-23 | Disposition: A | Discharge status: Home / Self Care | Payer: Medicare Other | Source: Ambulatory Visit | Attending: Vascular Surgery | Admitting: Vascular Surgery

## 2020-08-23 VITALS — BP 118/67 | HR 52 | Temp 97.9°F | Resp 20 | Ht 71.0 in | Wt 203.0 lb

## 2020-08-23 DIAGNOSIS — I739 Peripheral vascular disease, unspecified: Secondary | ICD-10-CM

## 2020-08-23 DIAGNOSIS — I779 Disorder of arteries and arterioles, unspecified: Secondary | ICD-10-CM | POA: Insufficient documentation
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
## 2020-09-01 DIAGNOSIS — E538 Deficiency of other specified B group vitamins: Secondary | ICD-10-CM | POA: Diagnosis not present

## 2020-09-09 ENCOUNTER — Other Ambulatory Visit: Payer: Self-pay | Admitting: Cardiology

## 2020-09-14 DIAGNOSIS — E782 Mixed hyperlipidemia: Secondary | ICD-10-CM | POA: Diagnosis not present

## 2020-09-14 DIAGNOSIS — Z0000 Encounter for general adult medical examination without abnormal findings: Secondary | ICD-10-CM | POA: Diagnosis not present

## 2020-09-14 DIAGNOSIS — Z0001 Encounter for general adult medical examination with abnormal findings: Secondary | ICD-10-CM | POA: Diagnosis not present

## 2020-09-14 DIAGNOSIS — F17200 Nicotine dependence, unspecified, uncomplicated: Secondary | ICD-10-CM | POA: Diagnosis not present

## 2020-09-14 DIAGNOSIS — F17210 Nicotine dependence, cigarettes, uncomplicated: Secondary | ICD-10-CM | POA: Diagnosis not present

## 2020-09-14 DIAGNOSIS — E039 Hypothyroidism, unspecified: Secondary | ICD-10-CM | POA: Diagnosis not present

## 2020-10-02 DIAGNOSIS — E539 Vitamin B deficiency, unspecified: Secondary | ICD-10-CM | POA: Diagnosis not present

## 2020-10-04 ENCOUNTER — Other Ambulatory Visit: Payer: Self-pay | Admitting: Cardiology

## 2020-10-18 ENCOUNTER — Other Ambulatory Visit: Payer: Self-pay | Admitting: Cardiology

## 2020-10-27 ENCOUNTER — Telehealth: Payer: Self-pay | Admitting: Cardiology

## 2020-10-27 MED ORDER — ROSUVASTATIN CALCIUM 5 MG PO TABS: ORAL_TABLET | ORAL | 1 refills | Status: DC

## 2020-10-27 NOTE — Telephone Encounter (Signed)
Refilled  crestor 5 mg qd, #30 RF:1    Has f/u apt in September 2022

## 2020-10-27 NOTE — Telephone Encounter (Signed)
*  STAT* If patient is at the pharmacy, call can be transferred to refill team.   1. Which medications need to be refilled? (please list name of each medication and dose if known) rosuvastatin calcium 5 mg  2. Which pharmacy/location (including street and city if local pharmacy) is medication to be sent to? [facility] [address]   3. Do they need a 30 day or 90 day supply? [place]

## 2020-11-02 DIAGNOSIS — E538 Deficiency of other specified B group vitamins: Secondary | ICD-10-CM | POA: Diagnosis not present

## 2020-11-20 ENCOUNTER — Other Ambulatory Visit: Payer: Self-pay | Admitting: Cardiology

## 2020-11-29 ENCOUNTER — Encounter: Payer: Self-pay | Admitting: Physician Assistant

## 2020-11-29 ENCOUNTER — Ambulatory Visit: Payer: Medicare Other | Admitting: Physician Assistant

## 2020-11-29 ENCOUNTER — Other Ambulatory Visit: Payer: Self-pay

## 2020-11-29 VITALS — BP 138/80 | HR 62 | Ht 71.0 in | Wt 200.0 lb

## 2020-11-29 DIAGNOSIS — E785 Hyperlipidemia, unspecified: Secondary | ICD-10-CM | POA: Insufficient documentation

## 2020-11-29 DIAGNOSIS — I739 Peripheral vascular disease, unspecified: Secondary | ICD-10-CM

## 2020-11-29 DIAGNOSIS — I10 Essential (primary) hypertension: Secondary | ICD-10-CM | POA: Diagnosis not present

## 2020-11-29 DIAGNOSIS — E782 Mixed hyperlipidemia: Secondary | ICD-10-CM | POA: Diagnosis not present

## 2020-11-29 NOTE — Patient Instructions (Signed)
Medication Instructions:  Your physician recommends that you continue on your current medications as directed. Please refer to the Current Medication list given to you today.  *If you need a refill on your cardiac medications before your next appointment, please call your pharmacy*   Lab Work: We have requested lab work from your primary care provider. If you have labs (blood work) drawn today and your tests are completely normal, you will receive your results only by: [place] (if you have MyChart) OR A paper copy in the mail If you have any lab test that is abnormal or we need to change your treatment, we will call you to review the results.   Testing/Procedures: None   Follow-Up: At [facility], you and your health needs are our priority.  As part of our continuing mission to provide you with exceptional heart care, we have created designated Provider Care Teams.  These Care Teams include your primary Cardiologist (physician) and Advanced Practice Providers (APPs -  Physician Assistants and Nurse Practitioners) who all work together to provide you with the care you need, when you need it.  We recommend signing up for the patient portal called "MyChart".  Sign up information is provided on this After Visit Summary.  MyChart is used to connect with patients for Virtual Visits (Telemedicine).  Patients are able to view lab/test results, encounter notes, upcoming appointments, etc.  Non-urgent messages can be sent to your provider as well.   To learn more about what you can do with MyChart, go to [email or web address].    Your next appointment:   1 year(s)  The format for your next appointment:   In Person  Provider:   Rozann Lesches, MD   Other Instructions
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
## 2020-11-30 NOTE — Addendum Note (Signed)
Addended by: Christella Scheuermann C on: 11/30/2020 08:01 AM   Modules accepted: Orders

## 2020-12-04 DIAGNOSIS — D510 Vitamin B12 deficiency anemia due to intrinsic factor deficiency: Secondary | ICD-10-CM | POA: Diagnosis not present

## 2020-12-22 DIAGNOSIS — E782 Mixed hyperlipidemia: Secondary | ICD-10-CM | POA: Diagnosis not present

## 2020-12-22 DIAGNOSIS — I4891 Unspecified atrial fibrillation: Secondary | ICD-10-CM | POA: Diagnosis not present

## 2020-12-22 DIAGNOSIS — G894 Chronic pain syndrome: Secondary | ICD-10-CM | POA: Diagnosis not present

## 2020-12-26 ENCOUNTER — Other Ambulatory Visit: Payer: Self-pay | Admitting: Cardiology

## 2021-01-02 DIAGNOSIS — E538 Deficiency of other specified B group vitamins: Secondary | ICD-10-CM | POA: Diagnosis not present

## 2021-02-02 DIAGNOSIS — E538 Deficiency of other specified B group vitamins: Secondary | ICD-10-CM | POA: Diagnosis not present

## 2021-03-05 DIAGNOSIS — E538 Deficiency of other specified B group vitamins: Secondary | ICD-10-CM | POA: Diagnosis not present

## 2021-04-04 DIAGNOSIS — E538 Deficiency of other specified B group vitamins: Secondary | ICD-10-CM | POA: Diagnosis not present

## 2021-04-06 ENCOUNTER — Other Ambulatory Visit: Payer: Self-pay

## 2021-04-06 ENCOUNTER — Encounter (HOSPITAL_COMMUNITY): Payer: Self-pay

## 2021-04-06 ENCOUNTER — Emergency Department (HOSPITAL_COMMUNITY)
Admission: EM | Admit: 2021-04-06 | Discharge: 2021-04-06 | Disposition: A | Discharge status: Home / Self Care | Payer: Medicare Other | Attending: Emergency Medicine | Admitting: Emergency Medicine

## 2021-04-06 DIAGNOSIS — I10 Essential (primary) hypertension: Secondary | ICD-10-CM | POA: Insufficient documentation

## 2021-04-06 DIAGNOSIS — N179 Acute kidney failure, unspecified: Secondary | ICD-10-CM | POA: Diagnosis not present

## 2021-04-06 DIAGNOSIS — Z743 Need for continuous supervision: Secondary | ICD-10-CM | POA: Diagnosis not present

## 2021-04-06 DIAGNOSIS — R6889 Other general symptoms and signs: Secondary | ICD-10-CM | POA: Diagnosis not present

## 2021-04-06 DIAGNOSIS — E039 Hypothyroidism, unspecified: Secondary | ICD-10-CM | POA: Diagnosis not present

## 2021-04-06 DIAGNOSIS — I4510 Unspecified right bundle-branch block: Secondary | ICD-10-CM | POA: Diagnosis not present

## 2021-04-06 DIAGNOSIS — R404 Transient alteration of awareness: Secondary | ICD-10-CM | POA: Diagnosis not present

## 2021-04-06 DIAGNOSIS — Z79899 Other long term (current) drug therapy: Secondary | ICD-10-CM | POA: Diagnosis not present

## 2021-04-06 DIAGNOSIS — I499 Cardiac arrhythmia, unspecified: Secondary | ICD-10-CM | POA: Diagnosis not present

## 2021-04-06 DIAGNOSIS — Z7982 Long term (current) use of aspirin: Secondary | ICD-10-CM | POA: Insufficient documentation

## 2021-04-06 DIAGNOSIS — R55 Syncope and collapse: Secondary | ICD-10-CM | POA: Insufficient documentation

## 2021-04-06 LAB — CBC WITH DIFFERENTIAL/PLATELET
Abs Immature Granulocytes: 0.03 10*3/uL (ref 0.00–0.07)
Basophils Absolute: 0 10*3/uL (ref 0.0–0.1)
Basophils Relative: 0 %
Eosinophils Absolute: 0.1 10*3/uL (ref 0.0–0.5)
Eosinophils Relative: 1 %
HCT: 43.7 % (ref 39.0–52.0)
Hemoglobin: 14.6 g/dL (ref 13.0–17.0)
Immature Granulocytes: 0 %
Lymphocytes Relative: 10 %
Lymphs Abs: 0.9 10*3/uL (ref 0.7–4.0)
MCH: 34 pg (ref 26.0–34.0)
MCHC: 33.4 g/dL (ref 30.0–36.0)
MCV: 101.6 fL — ABNORMAL HIGH (ref 80.0–100.0)
Monocytes Absolute: 0.5 10*3/uL (ref 0.1–1.0)
Monocytes Relative: 6 %
Neutro Abs: 7 10*3/uL (ref 1.7–7.7)
Neutrophils Relative %: 83 %
Platelets: 186 10*3/uL (ref 150–400)
RBC: 4.3 MIL/uL (ref 4.22–5.81)
RDW: 12.7 % (ref 11.5–15.5)
WBC: 8.6 10*3/uL (ref 4.0–10.5)
nRBC: 0 % (ref 0.0–0.2)

## 2021-04-06 LAB — BASIC METABOLIC PANEL
Anion gap: 5 (ref 5–15)
BUN: 13 mg/dL (ref 8–23)
CO2: 28 mmol/L (ref 22–32)
Calcium: 8.9 mg/dL (ref 8.9–10.3)
Chloride: 102 mmol/L (ref 98–111)
Creatinine, Ser: 1.78 mg/dL — ABNORMAL HIGH (ref 0.61–1.24)
GFR, Estimated: 39 mL/min — ABNORMAL LOW (ref >60)
Glucose, Bld: 105 mg/dL — ABNORMAL HIGH (ref 70–99)
Potassium: 5 mmol/L (ref 3.5–5.1)
Sodium: 135 mmol/L (ref 135–145)

## 2021-04-06 LAB — TROPONIN I (HIGH SENSITIVITY)
Troponin I (High Sensitivity): 5 ng/L (ref <18)
Troponin I (High Sensitivity): 4 ng/L (ref <18)

## 2021-04-06 LAB — CBG MONITORING, ED: Glucose-Capillary: 88 mg/dL (ref 70–99)

## 2021-04-06 MED ORDER — SODIUM CHLORIDE 0.9 % IV BOLUS
1000.0000 mL | Freq: Once | INTRAVENOUS | Status: AC
  Administered 2021-04-06: 1000 mL via INTRAVENOUS

## 2021-04-06 MED ORDER — SODIUM CHLORIDE 0.9 % IV BOLUS
500.0000 mL | Freq: Once | INTRAVENOUS | Status: AC
  Administered 2021-04-06: 500 mL via INTRAVENOUS
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
## 2021-04-06 NOTE — Discharge Instructions (Addendum)
Please make sure to drink at least 6 bottles of water daily. Avoid lots of soda. Follow up with your primary care doctor to have your kidneys rechecked Get help right away if: You faint. You hit your head or are injured after fainting. You have any of these symptoms that may indicate trouble with your heart: Fast or irregular heartbeats (palpitations). Unusual pain in your chest, abdomen, or back. Shortness of breath. You have a seizure. You have a severe headache. You are confused. You have vision problems. You have severe weakness or trouble walking. You are bleeding from your mouth or rectum, or you have black or tarry stool.

## 2021-04-06 NOTE — ED Triage Notes (Signed)
Patient to ED via EMS for episode of near syncope. When EMS arrived patient was diaphoretic with SBP of 70. States that he took BP meds this am.

## 2021-04-06 NOTE — ED Provider Notes (Signed)
Patient presents with syncopal episode when he was standing up and tried to move to go sit down, became lightheaded and slid down the wall and had a brief syncopal episode, he was pale and sweaty, this resolved quickly, on arrival he had some orthostatic vital sign changes, he had a creatinine that was increased from his baseline but not over 2, he was given a liter and half of IV fluids and on my exam is well-appearing without any complaints whatsoever.  Normal cardiac and pulmonary exams, no edema, neurologically awake alert and oriented at his baseline according to the family member in the room.  He is very stable for discharge and agreeable to return should symptoms worsen and to follow-up with family doctor as needed.  Cardiac monitoring and EKG have not shown any signs of pathologic arrhythmias or other ischemic concerns  Medical screening examination/treatment/procedure(s) were conducted as a shared visit with non-physician practitioner(s) and myself.  I personally evaluated the patient during the encounter.  Clinical Impression:   Final diagnoses:  Syncope, unspecified syncope type  Renal insufficiency      EKG Interpretation  Date/Time:  Friday April 06 2021 11:36:30 EST Ventricular Rate:  62 PR Interval:  165 QRS Duration: 160 QT Interval:  478 QTC Calculation: 486 R Axis:   19 Text Interpretation: Sinus rhythm Atrial premature complexes Right bundle branch block Since last tracing rate slower Confirmed by Noemi Chapel [phone]) on 04/06/2021 4:02:13 PM          Noemi Chapel, MD 04/07/21 1540

## 2021-04-12 DIAGNOSIS — I951 Orthostatic hypotension: Secondary | ICD-10-CM | POA: Diagnosis not present

## 2021-04-12 DIAGNOSIS — I10 Essential (primary) hypertension: Secondary | ICD-10-CM | POA: Diagnosis not present

## 2021-05-04 DIAGNOSIS — E538 Deficiency of other specified B group vitamins: Secondary | ICD-10-CM | POA: Diagnosis not present

## 2021-07-24 ENCOUNTER — Telehealth: Payer: Self-pay | Admitting: Cardiology

## 2021-07-24 MED ORDER — ROSUVASTATIN CALCIUM 5 MG PO TABS: 5.0000 mg | ORAL_TABLET | Freq: Every day | ORAL | 1 refills | Status: DC

## 2021-07-24 NOTE — Telephone Encounter (Signed)
?*  STAT* If patient is at the pharmacy, call can be transferred to refill team. ? ? ?1. Which medications need to be refilled? (please list name of each medication and dose if known)  ? rosuvastatin (CRESTOR) 5 MG tablet  ? ? ?2. Which pharmacy/location (including street and city if local pharmacy) is medication to be sent to?  [id number] [place]  [phone] ?   ?  ? ? ?3. Do they need a 30 day or 90 day supply?  ?90 day ? ?

## 2021-07-24 NOTE — Telephone Encounter (Signed)
Completed.

## 2021-08-24 ENCOUNTER — Other Ambulatory Visit: Payer: Self-pay | Admitting: *Deleted

## 2021-08-24 DIAGNOSIS — I779 Disorder of arteries and arterioles, unspecified: Secondary | ICD-10-CM

## 2021-08-30 ENCOUNTER — Encounter: Payer: Self-pay | Admitting: Vascular Surgery

## 2021-08-30 ENCOUNTER — Ambulatory Visit: Payer: Medicare Other | Admitting: Vascular Surgery

## 2021-08-30 ENCOUNTER — Ambulatory Visit (HOSPITAL_COMMUNITY)
Admission: RE | Admit: 2021-08-30 | Discharge: 2021-08-30 | Disposition: A | Discharge status: Home / Self Care | Payer: Medicare Other | Source: Ambulatory Visit | Attending: Vascular Surgery | Admitting: Vascular Surgery

## 2021-08-30 VITALS — BP 97/66 | HR 58 | Temp 98.1°F | Resp 20 | Ht 71.0 in | Wt 199.0 lb

## 2021-08-30 DIAGNOSIS — I779 Disorder of arteries and arterioles, unspecified: Secondary | ICD-10-CM | POA: Diagnosis present

## 2021-08-30 DIAGNOSIS — I739 Peripheral vascular disease, unspecified: Secondary | ICD-10-CM
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
## 2021-08-30 NOTE — Progress Notes (Signed)
ABI completed. Refer to "CV Proc" under chart review to view preliminary results.  08/30/2021 2:26 PM Kelby Aline., MHA, RVT, RDCS, RDMS

## 2021-10-02 DIAGNOSIS — E538 Deficiency of other specified B group vitamins: Secondary | ICD-10-CM | POA: Diagnosis not present

## 2021-11-02 DIAGNOSIS — E538 Deficiency of other specified B group vitamins: Secondary | ICD-10-CM | POA: Diagnosis not present

## 2021-12-03 DIAGNOSIS — E538 Deficiency of other specified B group vitamins: Secondary | ICD-10-CM | POA: Diagnosis not present

## 2022-01-02 DIAGNOSIS — E538 Deficiency of other specified B group vitamins: Secondary | ICD-10-CM | POA: Diagnosis not present

## 2022-01-03 DIAGNOSIS — Z23 Encounter for immunization: Secondary | ICD-10-CM | POA: Diagnosis not present

## 2022-01-30 ENCOUNTER — Other Ambulatory Visit: Payer: Self-pay | Admitting: *Deleted

## 2022-01-30 MED ORDER — ROSUVASTATIN CALCIUM 5 MG PO TABS: 5.0000 mg | ORAL_TABLET | Freq: Every day | ORAL | 1 refills | Status: DC
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
## 2022-02-01 ENCOUNTER — Ambulatory Visit: Payer: Medicare Other | Attending: Cardiology | Admitting: Cardiology

## 2022-02-01 ENCOUNTER — Other Ambulatory Visit (HOSPITAL_COMMUNITY)
Admission: RE | Admit: 2022-02-01 | Discharge: 2022-02-01 | Disposition: A | Discharge status: Home / Self Care | Payer: Medicare Other | Source: Ambulatory Visit | Attending: Cardiology | Admitting: Cardiology

## 2022-02-01 ENCOUNTER — Encounter: Payer: Self-pay | Admitting: Cardiology

## 2022-02-01 ENCOUNTER — Telehealth: Payer: Self-pay | Admitting: Cardiology

## 2022-02-01 VITALS — BP 110/60 | HR 58 | Ht 71.0 in | Wt 197.4 lb

## 2022-02-01 DIAGNOSIS — E782 Mixed hyperlipidemia: Secondary | ICD-10-CM | POA: Insufficient documentation

## 2022-02-01 DIAGNOSIS — I10 Essential (primary) hypertension: Secondary | ICD-10-CM

## 2022-02-01 DIAGNOSIS — I739 Peripheral vascular disease, unspecified: Secondary | ICD-10-CM | POA: Diagnosis not present

## 2022-02-01 DIAGNOSIS — E538 Deficiency of other specified B group vitamins: Secondary | ICD-10-CM | POA: Diagnosis not present

## 2022-02-01 LAB — LIPID PANEL
Cholesterol: 105 mg/dL (ref 0–200)
HDL: 41 mg/dL (ref >40)
LDL Cholesterol: 50 mg/dL (ref 0–99)
Total CHOL/HDL Ratio: 2.6 RATIO
Triglycerides: 69 mg/dL (ref <150)
VLDL: 14 mg/dL (ref 0–40)

## 2022-02-01 MED ORDER — ROSUVASTATIN CALCIUM 5 MG PO TABS: 5.0000 mg | ORAL_TABLET | Freq: Every day | ORAL | 1 refills | Status: DC

## 2022-02-01 NOTE — Telephone Encounter (Signed)
Completed.

## 2022-02-01 NOTE — Patient Instructions (Signed)
Medication Instructions:  Your physician recommends that you continue on your current medications as directed. Please refer to the Current Medication list given to you today.   Labwork: Fasting lipids   Testing/Procedures: None today  Follow-Up: 1 year  Any Other Special Instructions Will Be Listed Below (If Applicable).  If you need a refill on your cardiac medications before your next appointment, please call your pharmacy.

## 2022-02-01 NOTE — Telephone Encounter (Signed)
Pt c/o medication issue:  1. Name of Medication:  rosuvastatin (CRESTOR) 5 MG tablet   2. How are you currently taking this medication (dosage and times per day)? N/A  3. Are you having a reaction (difficulty breathing--STAT)? No   4. What is your medication issue? Wife called stating she went to [facility] and they advised they do not have this medication. [facility] is [facility] so [facility] are [facility] before the weekend.

## 2022-02-25 DIAGNOSIS — I4891 Unspecified atrial fibrillation: Secondary | ICD-10-CM | POA: Diagnosis not present

## 2022-02-25 DIAGNOSIS — I10 Essential (primary) hypertension: Secondary | ICD-10-CM | POA: Diagnosis not present

## 2022-02-25 DIAGNOSIS — G894 Chronic pain syndrome: Secondary | ICD-10-CM | POA: Diagnosis not present

## 2022-02-25 DIAGNOSIS — Z0001 Encounter for general adult medical examination with abnormal findings: Secondary | ICD-10-CM | POA: Diagnosis not present

## 2022-02-25 DIAGNOSIS — N183 Chronic kidney disease, stage 3 unspecified: Secondary | ICD-10-CM | POA: Diagnosis not present

## 2022-02-25 DIAGNOSIS — E039 Hypothyroidism, unspecified: Secondary | ICD-10-CM | POA: Diagnosis not present

## 2022-02-25 DIAGNOSIS — I739 Peripheral vascular disease, unspecified: Secondary | ICD-10-CM | POA: Diagnosis not present

## 2022-02-25 DIAGNOSIS — E538 Deficiency of other specified B group vitamins: Secondary | ICD-10-CM | POA: Diagnosis not present

## 2022-03-04 DIAGNOSIS — E538 Deficiency of other specified B group vitamins: Secondary | ICD-10-CM | POA: Diagnosis not present

## 2022-04-04 DIAGNOSIS — E538 Deficiency of other specified B group vitamins: Secondary | ICD-10-CM | POA: Diagnosis not present

## 2022-05-06 DIAGNOSIS — E538 Deficiency of other specified B group vitamins: Secondary | ICD-10-CM | POA: Diagnosis not present

## 2022-06-03 DIAGNOSIS — E538 Deficiency of other specified B group vitamins: Secondary | ICD-10-CM | POA: Diagnosis not present

## 2022-07-03 DIAGNOSIS — E538 Deficiency of other specified B group vitamins: Secondary | ICD-10-CM | POA: Diagnosis not present

## 2022-07-19 DIAGNOSIS — S86211A Strain of muscle(s) and tendon(s) of anterior muscle group at lower leg level, right leg, initial encounter: Secondary | ICD-10-CM | POA: Diagnosis not present

## 2022-07-19 DIAGNOSIS — I10 Essential (primary) hypertension: Secondary | ICD-10-CM | POA: Diagnosis not present

## 2022-07-19 DIAGNOSIS — N183 Chronic kidney disease, stage 3 unspecified: Secondary | ICD-10-CM | POA: Diagnosis not present

## 2022-07-19 DIAGNOSIS — I739 Peripheral vascular disease, unspecified: Secondary | ICD-10-CM | POA: Diagnosis not present

## 2022-08-01 ENCOUNTER — Encounter: Payer: Self-pay | Admitting: General Surgery

## 2022-08-01 ENCOUNTER — Ambulatory Visit: Payer: Medicare Other | Admitting: General Surgery

## 2022-08-01 VITALS — BP 119/72 | HR 60 | Temp 97.9°F | Resp 16 | Ht 71.0 in | Wt 199.0 lb

## 2022-08-01 DIAGNOSIS — E538 Deficiency of other specified B group vitamins: Secondary | ICD-10-CM | POA: Diagnosis not present

## 2022-08-01 DIAGNOSIS — R1032 Left lower quadrant pain: Secondary | ICD-10-CM | POA: Diagnosis not present

## 2022-08-01 MED ORDER — MELOXICAM 7.5 MG PO TABS: 15.0000 mg | ORAL_TABLET | Freq: Every day | ORAL | 1 refills | Status: DC
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
## 2022-09-02 DIAGNOSIS — E538 Deficiency of other specified B group vitamins: Secondary | ICD-10-CM | POA: Diagnosis not present

## 2022-09-27 ENCOUNTER — Other Ambulatory Visit: Payer: Self-pay | Admitting: General Surgery

## 2022-09-30 ENCOUNTER — Other Ambulatory Visit: Payer: Self-pay | Admitting: *Deleted

## 2022-09-30 DIAGNOSIS — I739 Peripheral vascular disease, unspecified: Secondary | ICD-10-CM

## 2022-09-30 NOTE — Telephone Encounter (Signed)
OV: 08/01/2022- Dr. Lovell Sheehan recommended patient follow up in (1) month to determine if Rx appropriate.   Rx approved for 15 days with message to pharmacy to have patient schedule F/U with surgeon, or obtain future Rx from PCP.

## 2022-10-02 DIAGNOSIS — E538 Deficiency of other specified B group vitamins: Secondary | ICD-10-CM | POA: Diagnosis not present

## 2022-10-10 ENCOUNTER — Encounter: Payer: Self-pay | Admitting: Vascular Surgery

## 2022-10-10 ENCOUNTER — Ambulatory Visit: Payer: Medicare Other | Admitting: Vascular Surgery

## 2022-10-10 ENCOUNTER — Ambulatory Visit (HOSPITAL_COMMUNITY)
Admission: RE | Admit: 2022-10-10 | Discharge: 2022-10-10 | Disposition: A | Discharge status: Home / Self Care | Payer: Medicare Other | Source: Ambulatory Visit | Attending: Vascular Surgery | Admitting: Vascular Surgery

## 2022-10-10 VITALS — BP 118/69 | HR 56 | Temp 98.1°F | Resp 16 | Ht 71.0 in | Wt 192.0 lb

## 2022-10-10 DIAGNOSIS — I739 Peripheral vascular disease, unspecified: Secondary | ICD-10-CM | POA: Insufficient documentation

## 2022-10-10 DIAGNOSIS — I70219 Atherosclerosis of native arteries of extremities with intermittent claudication, unspecified extremity: Secondary | ICD-10-CM | POA: Diagnosis not present

## 2022-10-10 LAB — VAS US ABI WITH/WO TBI
Left ABI: 0.55
Right ABI: 0.82
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
## 2022-10-26 ENCOUNTER — Other Ambulatory Visit: Payer: Self-pay | Admitting: Cardiology

## 2022-11-04 DIAGNOSIS — E538 Deficiency of other specified B group vitamins: Secondary | ICD-10-CM | POA: Diagnosis not present

## 2022-12-03 DIAGNOSIS — E538 Deficiency of other specified B group vitamins: Secondary | ICD-10-CM | POA: Diagnosis not present

## 2023-01-02 DIAGNOSIS — E538 Deficiency of other specified B group vitamins: Secondary | ICD-10-CM | POA: Diagnosis not present

## 2023-02-03 DIAGNOSIS — E538 Deficiency of other specified B group vitamins: Secondary | ICD-10-CM | POA: Diagnosis not present

## 2023-02-13 ENCOUNTER — Ambulatory Visit: Payer: Medicare Other | Attending: Cardiology | Admitting: Cardiology

## 2023-02-13 ENCOUNTER — Encounter: Payer: Self-pay | Admitting: Cardiology

## 2023-02-13 VITALS — BP 122/70 | HR 62 | Ht 71.0 in | Wt 192.4 lb

## 2023-02-13 DIAGNOSIS — I10 Essential (primary) hypertension: Secondary | ICD-10-CM | POA: Diagnosis not present

## 2023-02-13 DIAGNOSIS — I739 Peripheral vascular disease, unspecified: Secondary | ICD-10-CM | POA: Diagnosis not present

## 2023-02-13 DIAGNOSIS — I779 Disorder of arteries and arterioles, unspecified: Secondary | ICD-10-CM

## 2023-02-13 DIAGNOSIS — E782 Mixed hyperlipidemia: Secondary | ICD-10-CM | POA: Diagnosis not present

## 2023-02-13 NOTE — Progress Notes (Signed)
    Cardiology Office Note  Date: 02/13/2023   ID: [name], [name] 19-Jul-1940, MRN [id number]  History of Present Illness: [name] is an 82 y.o. male last seen in November 2023.  He is here for a routine visit.  Reports no exertional chest pain and stable NYHA class II dyspnea.  No palpitations or syncope.  Remains active taking care of his property.  I reviewed his medications.  Current cardiac regimen includes aspirin, lisinopril, Toprol-XL, and Crestor.  His last LDL was 50.  He continues to follow at [facility].  ECG today shows sinus rhythm with right bundle branch block and leftward axis.  I reviewed last office note by Dr. Edilia Bo with [facility].  Follow-up is planned as needed.  Physical Exam: VS:  BP 122/70   Pulse 62   Ht 5\' 11"  (1.803 m)   Wt 192 lb 6.4 oz (87.3 kg)   SpO2 94%   BMI 26.83 kg/m , BMI Body mass index is 26.83 kg/m.  Wt Readings from Last 3 Encounters:  02/13/23 192 lb 6.4 oz (87.3 kg)  10/10/22 192 lb (87.1 kg)  08/01/22 199 lb (90.3 kg)    General: Patient appears comfortable at rest. HEENT: Conjunctiva and lids normal. Neck: Supple, no elevated JVP or carotid bruits. Lungs: Clear to auscultation, nonlabored breathing at rest. Cardiac: Regular rate and rhythm, no S3, 1/6 systolic murmur. Extremities: No pitting edema.  ECG:  An ECG dated 04/06/2021 was personally reviewed today and demonstrated:  Sinus rhythm with PAC and right bundle branch block.  Labwork:    Component Value Date/Time   CHOL 105 02/01/2022 1008   TRIG 69 02/01/2022 1008   HDL 41 02/01/2022 1008   CHOLHDL 2.6 02/01/2022 1008   VLDL 14 02/01/2022 1008   LDLCALC 50 02/01/2022 1008  December 2023: Hemoglobin 14.9, platelets 191, TSH 0.45  Other Studies Reviewed Today:  No interval cardiac testing for review today.  Assessment and Plan:  1.  Mixed hyperlipidemia, LDL 50 in November 2023.  Continue Crestor at current dose.  Follow-up repeat lipid panel with  PCP this year.   2.  Primary hypertension.  Blood pressure is well-controlled today.  Continue Toprol-XL and lisinopril.   3.  PAD followed by Dr. Edilia Bo.  Follow-up ABI/TBI in July were 0.82/0.49 on the right and 0.55/0.25 on the left.  Reports stable claudication.  Continue aspirin and Crestor.  Disposition:  Follow up  1 year.  Signed, Jonelle Sidle, M.D., F.A.C.C. [facility]

## 2023-02-13 NOTE — Patient Instructions (Signed)
Medication Instructions:  Your physician recommends that you continue on your current medications as directed. Please refer to the Current Medication list given to you today.   Labwork: None today  Testing/Procedures: None today  Follow-Up: 1 year  Any Other Special Instructions Will Be Listed Below (If Applicable).  If you need a refill on your cardiac medications before your next appointment, please call your pharmacy.  

## 2023-03-04 DIAGNOSIS — N183 Chronic kidney disease, stage 3 unspecified: Secondary | ICD-10-CM | POA: Diagnosis not present

## 2023-03-04 DIAGNOSIS — I10 Essential (primary) hypertension: Secondary | ICD-10-CM | POA: Diagnosis not present

## 2023-03-04 DIAGNOSIS — E039 Hypothyroidism, unspecified: Secondary | ICD-10-CM | POA: Diagnosis not present

## 2023-03-04 DIAGNOSIS — Z0001 Encounter for general adult medical examination with abnormal findings: Secondary | ICD-10-CM | POA: Diagnosis not present

## 2023-03-04 DIAGNOSIS — D518 Other vitamin B12 deficiency anemias: Secondary | ICD-10-CM | POA: Diagnosis not present

## 2023-03-04 DIAGNOSIS — I739 Peripheral vascular disease, unspecified: Secondary | ICD-10-CM | POA: Diagnosis not present

## 2023-03-04 DIAGNOSIS — I4891 Unspecified atrial fibrillation: Secondary | ICD-10-CM | POA: Diagnosis not present

## 2023-03-08 DIAGNOSIS — Z1211 Encounter for screening for malignant neoplasm of colon: Secondary | ICD-10-CM | POA: Diagnosis not present

## 2023-03-12 DIAGNOSIS — E782 Mixed hyperlipidemia: Secondary | ICD-10-CM | POA: Diagnosis not present

## 2023-03-12 DIAGNOSIS — Z0001 Encounter for general adult medical examination with abnormal findings: Secondary | ICD-10-CM | POA: Diagnosis not present

## 2023-03-12 DIAGNOSIS — E559 Vitamin D deficiency, unspecified: Secondary | ICD-10-CM | POA: Diagnosis not present

## 2023-03-12 DIAGNOSIS — G9332 Myalgic encephalomyelitis/chronic fatigue syndrome: Secondary | ICD-10-CM | POA: Diagnosis not present

## 2023-03-12 DIAGNOSIS — D518 Other vitamin B12 deficiency anemias: Secondary | ICD-10-CM | POA: Diagnosis not present

## 2023-03-13 ENCOUNTER — Other Ambulatory Visit: Payer: Self-pay | Admitting: Cardiology

## 2023-05-13 DIAGNOSIS — E039 Hypothyroidism, unspecified: Secondary | ICD-10-CM | POA: Diagnosis not present

## 2023-06-12 DIAGNOSIS — D518 Other vitamin B12 deficiency anemias: Secondary | ICD-10-CM | POA: Diagnosis not present

## 2023-06-25 DIAGNOSIS — E039 Hypothyroidism, unspecified: Secondary | ICD-10-CM | POA: Diagnosis not present

## 2023-07-21 DIAGNOSIS — E039 Hypothyroidism, unspecified: Secondary | ICD-10-CM | POA: Diagnosis not present

## 2023-07-21 DIAGNOSIS — D518 Other vitamin B12 deficiency anemias: Secondary | ICD-10-CM | POA: Diagnosis not present

## 2023-08-20 DIAGNOSIS — D518 Other vitamin B12 deficiency anemias: Secondary | ICD-10-CM | POA: Diagnosis not present

## 2023-09-19 DIAGNOSIS — D518 Other vitamin B12 deficiency anemias: Secondary | ICD-10-CM | POA: Diagnosis not present

## 2023-10-01 DIAGNOSIS — E039 Hypothyroidism, unspecified: Secondary | ICD-10-CM | POA: Diagnosis not present

## 2023-11-10 DIAGNOSIS — E039 Hypothyroidism, unspecified: Secondary | ICD-10-CM | POA: Diagnosis not present

## 2023-12-11 DIAGNOSIS — I10 Essential (primary) hypertension: Secondary | ICD-10-CM | POA: Diagnosis not present

## 2023-12-11 DIAGNOSIS — K529 Noninfective gastroenteritis and colitis, unspecified: Secondary | ICD-10-CM | POA: Diagnosis not present

## 2023-12-11 DIAGNOSIS — E538 Deficiency of other specified B group vitamins: Secondary | ICD-10-CM | POA: Diagnosis not present

## 2023-12-11 DIAGNOSIS — Z79899 Other long term (current) drug therapy: Secondary | ICD-10-CM | POA: Diagnosis not present

## 2023-12-11 DIAGNOSIS — Z23 Encounter for immunization: Secondary | ICD-10-CM | POA: Diagnosis not present

## 2023-12-11 DIAGNOSIS — F17200 Nicotine dependence, unspecified, uncomplicated: Secondary | ICD-10-CM | POA: Diagnosis not present

## 2023-12-11 DIAGNOSIS — E039 Hypothyroidism, unspecified: Secondary | ICD-10-CM | POA: Diagnosis not present

## 2023-12-11 DIAGNOSIS — Z7689 Persons encountering health services in other specified circumstances: Secondary | ICD-10-CM | POA: Diagnosis not present

## 2023-12-16 DIAGNOSIS — F17210 Nicotine dependence, cigarettes, uncomplicated: Secondary | ICD-10-CM | POA: Diagnosis not present

## 2023-12-16 DIAGNOSIS — E039 Hypothyroidism, unspecified: Secondary | ICD-10-CM | POA: Diagnosis not present

## 2023-12-16 DIAGNOSIS — Z79899 Other long term (current) drug therapy: Secondary | ICD-10-CM | POA: Diagnosis not present

## 2023-12-16 DIAGNOSIS — N289 Disorder of kidney and ureter, unspecified: Secondary | ICD-10-CM | POA: Diagnosis not present

## 2023-12-16 DIAGNOSIS — E538 Deficiency of other specified B group vitamins: Secondary | ICD-10-CM | POA: Diagnosis not present

## 2023-12-16 DIAGNOSIS — I10 Essential (primary) hypertension: Secondary | ICD-10-CM | POA: Diagnosis not present

## 2024-04-23 ENCOUNTER — Other Ambulatory Visit: Payer: Self-pay | Admitting: Cardiology

## 2024-04-28 ENCOUNTER — Encounter: Payer: Self-pay | Admitting: Student

## 2024-04-28 ENCOUNTER — Ambulatory Visit: Admitting: Student

## 2024-04-28 VITALS — BP 112/60 | HR 70 | Ht 71.0 in | Wt 199.8 lb

## 2024-04-28 DIAGNOSIS — E785 Hyperlipidemia, unspecified: Secondary | ICD-10-CM

## 2024-04-28 DIAGNOSIS — I10 Essential (primary) hypertension: Secondary | ICD-10-CM

## 2024-04-28 DIAGNOSIS — I739 Peripheral vascular disease, unspecified: Secondary | ICD-10-CM

## 2024-04-28 MED ORDER — ROSUVASTATIN CALCIUM 5 MG PO TABS: 5.0000 mg | ORAL_TABLET | Freq: Every day | ORAL | 3 refills | Status: AC

## 2024-04-28 NOTE — Progress Notes (Signed)
 "  Cardiology Office Note    Date:  04/28/2024  ID:  [name], [name] 1941/01/15, MRN [id number] Cardiologist: Jayson Sierras, MD { : History of Present Illness:    [name] is a 84 y.o. male with past medical history of HTN, HLD and PAD who presents to the office today for annual follow-up.  He was last examined by Dr. Sierras in 01/2023 and reported NYHA class II dyspnea but denied any chest pain. Was being followed by Vascular given his PAD. No changes were made to his cardiac medications and he was continued on ASA 81 mg daily, Lisinopril  2.5 mg daily, Toprol -XL 25 mg daily and Crestor  5 mg daily.  In talking with the patient today, he reports overall doing well from a cardiac perspective since his last office visit. 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 He walks around the farm at times and denies any recent chest pain or dyspnea on exertion with this. Reports an occasional aching pain along his legs but this has overall been stable and no acute changes. Was previously informed to follow-up with Vascular as needed. He denies any orthopnea, PND, pitting edema or palpitations.  Studies Reviewed:   EKG: EKG is ordered today and demonstrates:   EKG Interpretation Date/Time:  Wednesday April 28 2024 13:24:24 EST Ventricular Rate:  67 PR Interval:  144 QRS Duration:  140 QT Interval:  428 QTC Calculation: 452 R Axis:   -68  Text Interpretation: Normal sinus rhythm Right bundle branch block Left axis deviation Confirmed by Johnson Grate ([id number]) on 04/28/2024 2:15:01 PM       Echocardiogram: 05/2019 IMPRESSIONS     1. Left ventricular ejection fraction, by estimation, is 55 to 60%. The  left ventricle has normal function. The left ventricle has no regional  wall motion abnormalities. Left ventricular diastolic parameters are  consistent with Grade I diastolic  dysfunction (impaired relaxation).   2. Right  ventricular systolic function is normal. The right ventricular  size is normal.   3. The mitral valve is grossly normal. Trivial mitral valve  regurgitation.   4. The aortic valve is tricuspid. Aortic valve regurgitation is not  visualized.   5. The inferior vena cava is normal in size with greater than 50%  respiratory variability, suggesting right atrial pressure of 3 mmHg.    Physical Exam:   VS:  BP 112/60 (BP Location: Left Arm, Cuff Size: Normal)   Pulse 70   Ht 5' 11 (1.803 m)   Wt 199 lb 12.8 oz (90.6 kg)   SpO2 97%   BMI 27.87 kg/m    Wt Readings from Last 3 Encounters:  04/28/24 199 lb 12.8 oz (90.6 kg)  02/13/23 192 lb 6.4 oz (87.3 kg)  10/10/22 192 lb (87.1 kg)     GEN: Pleasant male appearing in no acute distress NECK: No JVD; No carotid bruits CARDIAC: RRR, no murmurs, rubs, gallops RESPIRATORY:  Clear to auscultation without rales, wheezing or rhonchi  ABDOMEN: Appears non-distended. No obvious abdominal masses. EXTREMITIES: No clubbing or cyanosis. No pitting edema.  Distal pedal pulses are 2+ bilaterally.   Assessment and Plan:   1. Essential hypertension - His blood pressure is well-controlled at 112/60 during today's visit. Continue current medical therapy with Lisinopril  2.5 mg daily and Toprol -XL 25 mg daily. Creatinine was stable at 1.39 when checked in 11/2023.  2. Hyperlipidemia LDL goal <70 - His LDL was previously at 50  in 2023 but elevated at 125 when checked in 11/2023. Appears no changes were made to his statin in the interim and he has been continued on Crestor  5 mg daily but he does think he might of missed doses around that timeframe. Will request a repeat FLP with upcoming labs. If LDL remains above goal, would titrate Crestor  from 5 mg daily to 10 mg daily.  3. PAD (peripheral artery disease) - Previously followed by Dr. Eliza with Vascular Surgery. Reports having stable claudication but no progression of symptoms. Was previously informed  to follow-up with Vascular if he developed worsening pain or nonhealing ulcers.  Continue ASA and statin therapy.  Signed, Laymon CHRISTELLA Qua, [name]   "

## 2024-04-28 NOTE — Telephone Encounter (Signed)
 Lipids Completed on 12/11/23

## 2024-04-28 NOTE — Patient Instructions (Signed)
 Medication Instructions:  Your physician recommends that you continue on your current medications as directed. Please refer to the Current Medication list given to you today.  *If you need a refill on your cardiac medications before your next appointment, please call your pharmacy*  Lab Work: Your physician recommends that you return for lab work in: Fasting   If you have labs (blood work) drawn today and your tests are completely normal, you will receive your results only by: MyChart Message (if you have MyChart) OR A paper copy in the mail If you have any lab test that is abnormal or we need to change your treatment, we will call you to review the results.  Testing/Procedures: NONE   Follow-Up: At [facility], you and your health needs are our priority.  As part of our continuing mission to provide you with exceptional heart care, our providers are all part of one team.  This team includes your primary Cardiologist (physician) and Advanced Practice Providers or APPs (Physician Assistants and Nurse Practitioners) who all work together to provide you with the care you need, when you need it.  Your next appointment:   1 year(s)  Provider:   You may see Jayson Sierras, MD or one of the following Advanced Practice Providers on your designated Care Team:   Laymon Qua, [name]  [place], [place] Olivia [place], [place]     We recommend signing up for the patient portal called MyChart.  Sign up information is provided on this After Visit Summary.  MyChart is used to connect with patients for Virtual Visits (Telemedicine).  Patients are able to view lab/test results, encounter notes, upcoming appointments, etc.  Non-urgent messages can be sent to your provider as well.   To learn more about what you can do with MyChart, go to [email or web address].   Other Instructions Thank you for choosing [facility]!
